# Patient Record
Sex: Male | Born: 1960 | Race: White | Hispanic: No | Marital: Married | Smoking: Never smoker
Health system: Southern US, Community
[De-identification: ages and names within clinical notes are randomized; demographics above are authoritative.]

## PROBLEM LIST (undated history)

## (undated) DIAGNOSIS — I2510 Atherosclerotic heart disease of native coronary artery without angina pectoris: Secondary | ICD-10-CM

## (undated) DIAGNOSIS — T883XXA Malignant hyperthermia due to anesthesia, initial encounter: Secondary | ICD-10-CM

## (undated) DIAGNOSIS — F419 Anxiety disorder, unspecified: Secondary | ICD-10-CM

## (undated) DIAGNOSIS — Z8489 Family history of other specified conditions: Secondary | ICD-10-CM

## (undated) HISTORY — PX: MEDIAL COLLATERAL LIGAMENT AND LATERAL COLLATERAL LIGAMENT REPAIR, ELBOW: SHX2016

## (undated) HISTORY — PX: ARTHROSCOPY, HIP, WITH LABRUM REPAIR: SHX7156

## (undated) HISTORY — PX: MEDIAL COLLATERAL LIGAMENT REPAIR, ELBOW: SHX2018

---

## 1986-03-06 HISTORY — PX: ROTATOR CUFF REPAIR: SHX139

## 1996-03-06 HISTORY — PX: GANGLION CYST EXCISION: SHX1691

## 1998-03-06 HISTORY — PX: TONSILLECTOMY: SUR1361

## 1998-03-06 HISTORY — PX: ROTATOR CUFF REPAIR: SHX139

## 1998-03-06 HISTORY — PX: OTHER SURGICAL HISTORY: SHX169

## 1998-03-06 HISTORY — PX: SEPTOPLASTY: SUR1290

## 1999-10-10 ENCOUNTER — Ambulatory Visit (HOSPITAL_COMMUNITY): Admission: RE | Admit: 1999-10-10 | Discharge: 1999-10-10 | Payer: Self-pay | Admitting: Specialist

## 1999-10-10 ENCOUNTER — Encounter: Payer: Self-pay | Admitting: Specialist

## 2000-07-26 ENCOUNTER — Encounter: Admission: RE | Admit: 2000-07-26 | Discharge: 2000-07-26 | Payer: Self-pay | Admitting: Emergency Medicine

## 2000-07-26 ENCOUNTER — Encounter: Payer: Self-pay | Admitting: Emergency Medicine

## 2000-08-01 ENCOUNTER — Encounter: Admission: RE | Admit: 2000-08-01 | Discharge: 2000-08-01 | Payer: Self-pay | Admitting: Family Medicine

## 2000-08-01 ENCOUNTER — Encounter: Payer: Self-pay | Admitting: Family Medicine

## 2002-04-18 ENCOUNTER — Inpatient Hospital Stay (HOSPITAL_COMMUNITY): Admission: RE | Admit: 2002-04-18 | Discharge: 2002-04-20 | Payer: Self-pay | Admitting: Otolaryngology

## 2002-04-18 ENCOUNTER — Encounter (INDEPENDENT_AMBULATORY_CARE_PROVIDER_SITE_OTHER): Payer: Self-pay | Admitting: *Deleted

## 2003-11-27 ENCOUNTER — Encounter: Admission: RE | Admit: 2003-11-27 | Discharge: 2003-11-27 | Payer: Self-pay | Admitting: Family Medicine

## 2006-12-19 ENCOUNTER — Encounter: Admission: RE | Admit: 2006-12-19 | Discharge: 2006-12-19 | Payer: Self-pay | Admitting: Family Medicine

## 2007-01-03 ENCOUNTER — Encounter: Admission: RE | Admit: 2007-01-03 | Discharge: 2007-01-03 | Payer: Self-pay | Admitting: Family Medicine

## 2007-07-23 ENCOUNTER — Encounter: Admission: RE | Admit: 2007-07-23 | Discharge: 2007-07-23 | Payer: Self-pay | Admitting: Family Medicine

## 2007-08-05 ENCOUNTER — Encounter: Admission: RE | Admit: 2007-08-05 | Discharge: 2007-08-05 | Payer: Self-pay | Admitting: Family Medicine

## 2008-03-06 HISTORY — PX: INGUINAL HERNIA REPAIR: SUR1180

## 2008-08-06 ENCOUNTER — Ambulatory Visit (HOSPITAL_COMMUNITY): Admission: RE | Admit: 2008-08-06 | Discharge: 2008-08-07 | Payer: Self-pay | Admitting: General Surgery

## 2009-09-02 ENCOUNTER — Encounter: Admission: RE | Admit: 2009-09-02 | Discharge: 2009-09-02 | Payer: Self-pay | Admitting: Family Medicine

## 2009-09-02 IMAGING — CR DG CERVICAL SPINE COMPLETE 4+V
5 series · 5 of 5 positions shown · non-contrast
Comparison: None.

CLINICAL DATA: Left neck pain radiating to the left shoulder.
Headaches.

CERVICAL SPINE - COMPLETE 4+ VIEW

[view not recorded (1 of 5)]
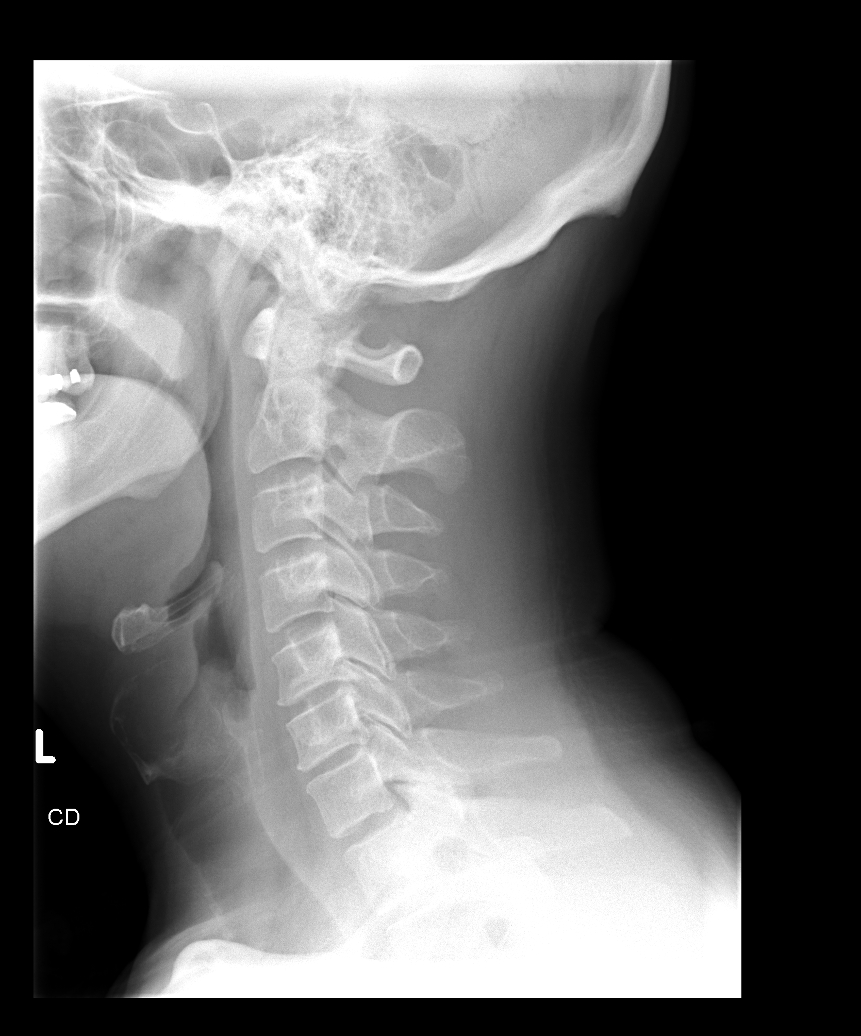

[view not recorded (2 of 5)]
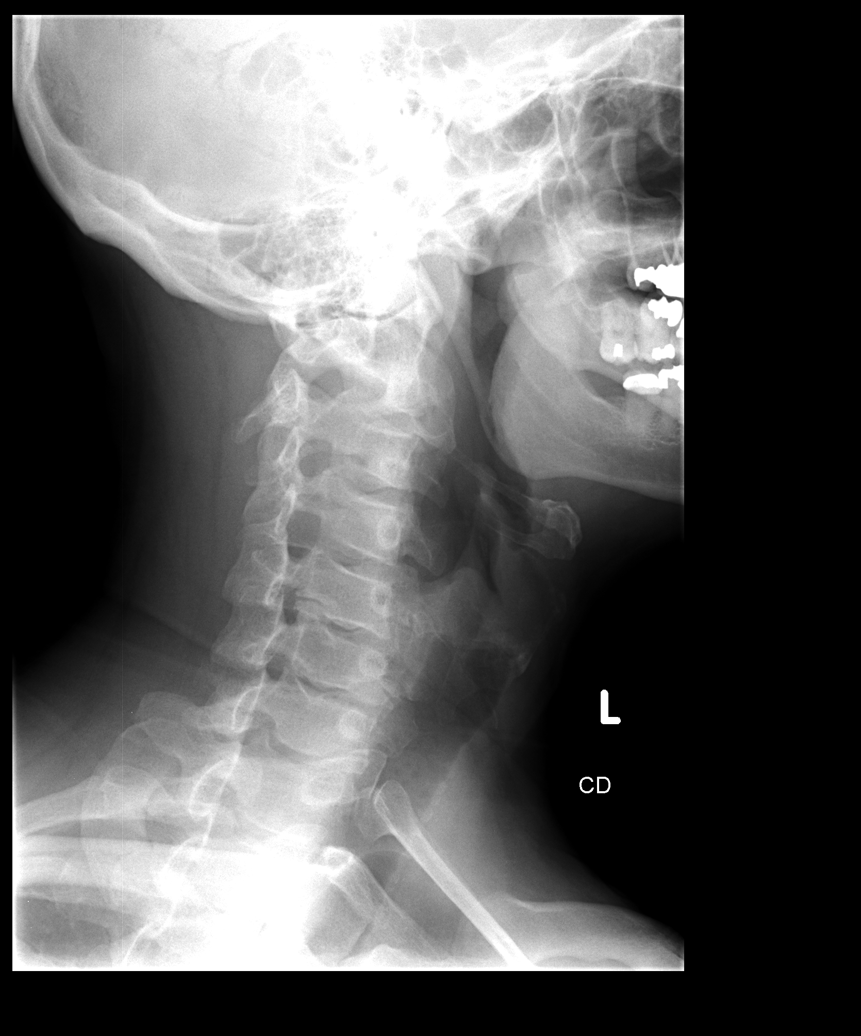

[view not recorded (3 of 5)]
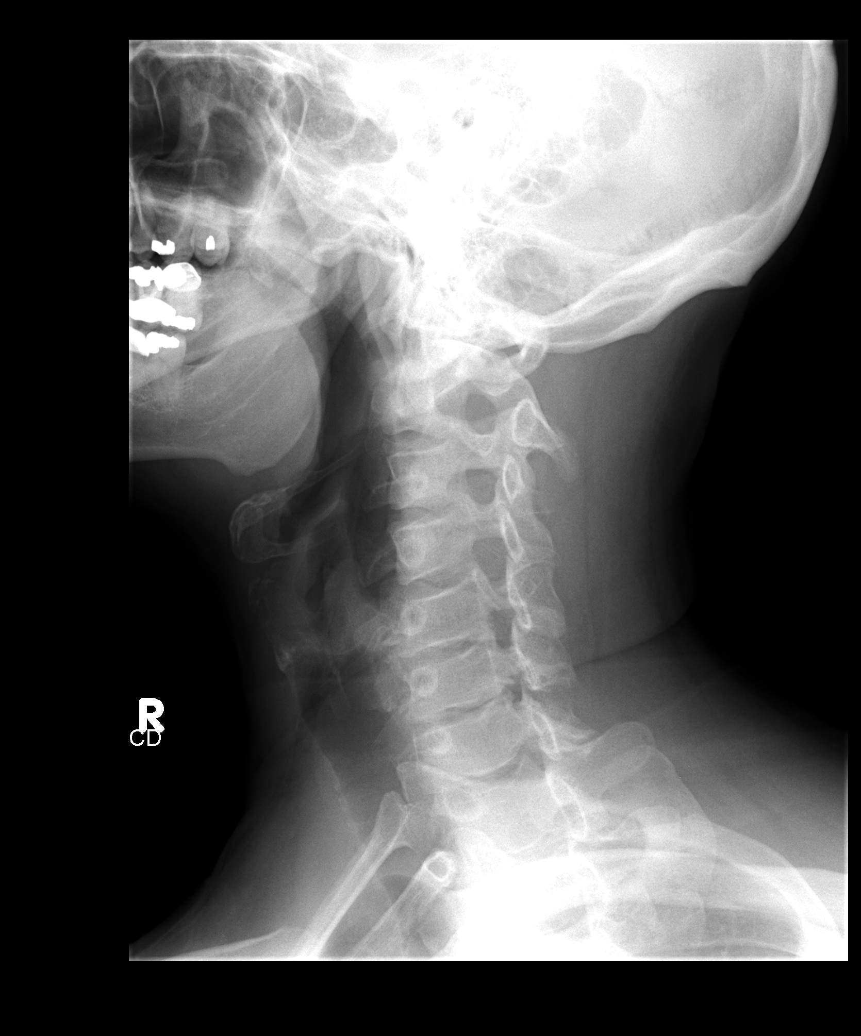

[view not recorded (4 of 5)]
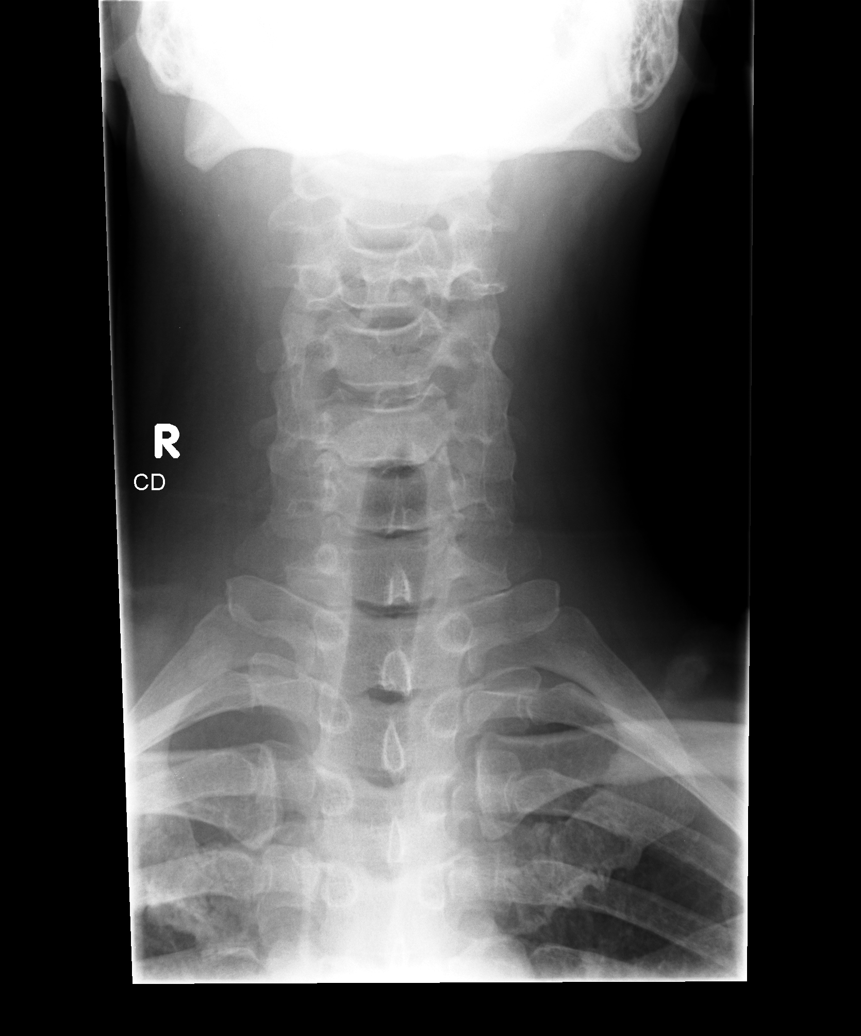

[view not recorded (5 of 5)]
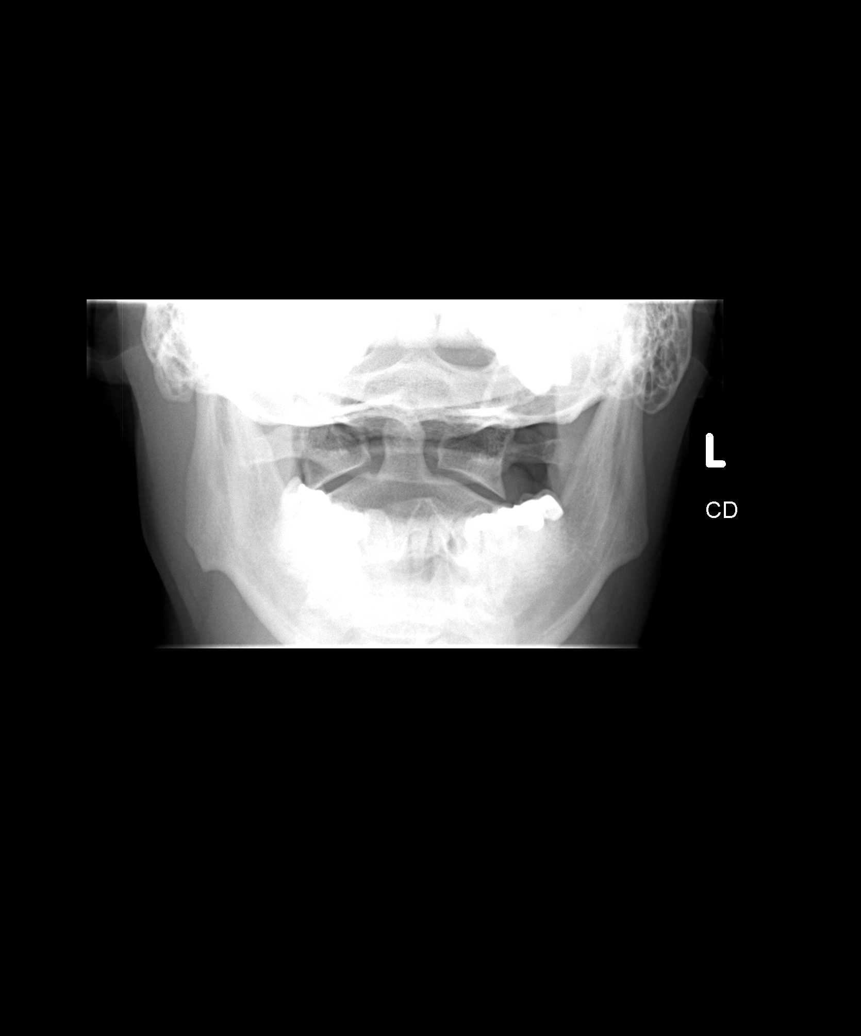

[5 of 5 positions shown; findings below may reference images not displayed]

FINDINGS: The cervical spine is visualized from the occiput to the
T1-2 junction.  Alignment is anatomic.  Prevertebral soft tissues
are within normal limits.  Vertebral body height is maintained.
Mild uncovertebral hypertrophy and endplate degenerative changes at
C5-6 and C6-7.  Mild loss of disc space height is seen at these
levels as well.  Mild right neural foraminal narrowing at C5-6 and
mild to moderate left foraminal narrowing at C6-7.  Visualized lung
apices are clear.
IMPRESSION: Spondylosis, worst at C5-6 and C6-7, as above.

## 2010-06-13 LAB — COMPREHENSIVE METABOLIC PANEL
Alkaline Phosphatase: 47 U/L (ref 39–117)
BUN: 10 mg/dL (ref 6–23)
CO2: 28 mEq/L (ref 19–32)
GFR calc non Af Amer: >60 mL/min (ref >60)
Glucose, Bld: 89 mg/dL (ref 70–99)
Potassium: 4.4 mEq/L (ref 3.5–5.1)
Total Protein: 6.8 g/dL (ref 6.0–8.3)

## 2010-06-13 LAB — DIFFERENTIAL
Basophils Absolute: 0 10*3/uL (ref 0.0–0.1)
Basophils Relative: 1 % (ref 0–1)
Eosinophils Absolute: 0.1 10*3/uL (ref 0.0–0.7)
Monocytes Relative: 10 % (ref 3–12)
Neutro Abs: 2.5 10*3/uL (ref 1.7–7.7)
Neutrophils Relative %: 49 % (ref 43–77)

## 2010-06-13 LAB — CBC
HCT: 44.5 % (ref 39.0–52.0)
Hemoglobin: 15.1 g/dL (ref 13.0–17.0)
MCHC: 33.9 g/dL (ref 30.0–36.0)
RBC: 4.88 MIL/uL (ref 4.22–5.81)
RDW: 13.2 % (ref 11.5–15.5)

## 2010-07-19 NOTE — Op Note (Signed)
NAME:  Isaac Knight, Isaac Knight NO.:  [id number]   MEDICAL RECORD NO.:  [id number]          PATIENT TYPE:  OIB   LOCATION:  5120                         FACILITY:  [facility]   PHYSICIAN:  Adolph Pollack, M.D.DATE OF BIRTH:  Jul 22, 1960   DATE OF PROCEDURE:  08/06/2008  DATE OF DISCHARGE:                               OPERATIVE REPORT   PREOPERATIVE DIAGNOSIS:  Left inguinal hernia.   POSTOPERATIVE DIAGNOSIS:  Left inguinal hernia.   PROCEDURE:  Laparoscopic repair of left inguinal hernia with mesh.   SURGEON:  Adolph Pollack, MD   ANESTHESIA:  General.   INDICATIONS:  Mr. Nancarrow is a 50 year old male who was doing some  outdoor work and lifting something heavy, noticed a swelling in the  groin area.  He had some intermittent stinging there and the swelling is  getting larger.  He has a left inguinal hernia by exam and now presents  for repair.  We discussed the open and laparoscopic repair and he has  chosen laparoscopic.  We went over the procedure risks and aftercare  preoperatively.   TECHNIQUE:  He was seen in the holding area and the left groin marked  with my initials.  He voided and then was brought back to the operating  room, placed supine on the operating table, and general anesthetic was  administered.  The hair on the abdominal wall and groin was clipped and  the area sterilely prepped and draped.  Marcaine was infiltrated in the  subumbilical region.  A small transverse subumbilical incision was made  through the skin and subcutaneous tissue.  Using blunt dissection, I  identified the anterior left rectus sheath and made a small transverse  incision in it.  The underlying rectus muscle was swept laterally  exposing the posterior rectus sheath.  A balloon dissection device was  then placed in the extraperitoneal space under laparoscopic vision.  Balloon dissection was performed of the left extraperitoneal space,  lower midline, and part of the  right extraperitoneal space.  Following  this, the balloon was removed and CO2 gas was insufflated creating a  working area.   Under laparoscopic vision, two 5-mm trocars were placed in the lower  abdomen just to the right side of the midline.  Using blunt dissection,  I exposed the pubic tubercle and then Cooper ligament on the left side.  I then dissected fibrofatty tissue away from the anterior and lateral  abdominal walls all the way back to the level of the umbilicus.  I  isolated the spermatic cord, created a window around it, and noted an  indirect sac.  Using blunt dissection, I dissected the indirect sac away  from the spermatic cord structures.  A patulous and enlarged internal  ring was noted.  There was a peritoneal tear made, but I was able to  roll it up and fold it on itself to seal it.  I dissected the sac back  to the level of the umbilicus.   Following this, I brought a piece of the Parietex TECR 15 x 15 mesh into  the field and cut it 5  x 6 edges.  A partial longitudinal slit was cut  into it and was placed into the left extraperitoneal space.  He was then  positioned, 2 cords were wrapped around the tails and at the mesh.  We  covered the direct, indirect, and femoral spaces with good overlap.  The  mesh was then anchored to [employer].  The anterior and lateral  abdominal wall with spiral tacks.   I then inspected the area and noted that the hemostasis was adequate.  The inferolateral aspect of the mesh was then held down.  The CO2 gas  was released and extraperitoneal contents were noted to approximate the  mesh.  I then removed all trocars.   The left anterior rectus sheath defect was then closed with interrupted  0-Vicryl sutures.  The skin incisions were closed with 4-0 Monocryl  subcuticular stitches.  Steri-Strips and sterile dressings were applied.   He tolerated the procedure without apparent complications and was taken  to recovery in satisfactory  condition.      Adolph Pollack, M.D.  Electronically Signed     TJR/MEDQ  D:  08/06/2008  T:  08/07/2008  Job:  [id number]

## 2010-07-22 NOTE — Op Note (Signed)
Isaac Knight, Isaac Knight NO.:  [id number]   MEDICAL RECORD NO.:  [id number]                   PATIENT TYPE:  OIB   LOCATION:  2550                                 FACILITY:  [facility]   PHYSICIAN:  Zola Button T. Lazarus Salines, M.D.              DATE OF BIRTH:  [birth year]   DATE OF PROCEDURE:  04/18/2002  DATE OF DISCHARGE:                                 OPERATIVE REPORT   PREOPERATIVE DIAGNOSES:  1. 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  2. Cryptic imbedded tonsils with frequent debris and halitosis.  3. Snoring.   POSTOPERATIVE DIAGNOSES:  1. 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  2. Cryptic imbedded tonsils with frequent debris and halitosis.  3. Snoring.   PROCEDURE:  1. Nasal septoplasty.  2. Bilateral SMR inferior turbinates.  3. Bilateral tonsillectomy.  4. LAUP.   SURGEON:  Gloris Manchester. Lazarus Salines, M.D.   ANESTHESIA:  General orotracheal.   ESTIMATED BLOOD LOSS:  Minimal.   COMPLICATIONS:  None.   FINDINGS:  Overall narrow nose with a rightward septal deviation and very  large inferior turbinates. No polyps or active drainage.  2+ bulky imbedded  tonsils with copious cryptic debris and moderate fibrosis.  A very long,  thick central uvula and a moderately long thin posterior tonsillar pillar on  each side.   DESCRIPTION OF PROCEDURE:  With the patient in a comfortable supine  position, general orotracheal anesthesia was induced without difficulty.  At  an appropriate level, the patient was placed in a slight sitting position  and the table was rotated.  Saline-moistened throat pack was placed.  Nasal  vibrissa were trimmed.  Cocaine crystals, 160 mg total were applied on  cotton carriers to the anterior ethmoid and sphenopalatine ganglion regions  on both sides.  Cocaine solution, 160 mg total was applied on 1/2 x 3 inch  cottonoids to both sides of  the septal mucosa.  1% Xylocaine with 1:100,000  epinephrine, 10 mL total was infiltrated into the anterior floor of the  nose, into the nasal spine region, into the membranous columella, and into  the submucoperichondrial plane of the septum on both sides.  Several minutes  were allowed for this to take effect. A sterile preparation and draping of  the mid face was accomplished.   The materials were removed from the nose, observed to be intact, and correct  in number. The findings were as described above. A left-sided  hemitransfixion incision was sharply executed and carried down to the caudal  edge of the quadrangular cartilage. This was continued down to a floor  incision. A small right-sided floor incision was executed.  Floor tunnels  were developed on both sides, brought up to the vomer, and brought forward  along the maxillary crest.  The submucoperichondrial plane of the left  septum was elevated up to the  dorsum of the nose, back to the perpendicular  plate, down to communicate with the posterior tunnel. It was quite fibrotic  in the region of the nasal spine, but was carefully dissected. There was one  small linear rent on the anterior inferior septum at this area.   Upon developing the septal tunnels, the findings were further identified as  above. The chondroethmoid junction was identified and opened with the caudal  elevator and the opposite submucoperiosteal plane of the perpendicular plate  was elevated.  The superior perpendicular plate was lysed with an open  Jansen-Middleton forceps to avoid rocking the cribriform plate region. The  inferior portion was submucosally dissected and rocked free using the closed  Jansen-Middleton forceps and with Takahashi forceps.  Small bony remnants  and spicules were carefully removed.  At this point, the posterior inferior  corner of the quadrangular cartilage leaving a 2 cm caudal and dorsal strut,  was submucosally resected and a 2  mm inferior incision was made along the  maxillary crest to remove the spurred quadrangular cartilage in this region.  This allowed the septum to trapdoor more freely toward the midline with good  support. A small additional portion of the posterior and inferior corner was  resected where it was angulated toward the right.  The nasal septum was  separated from the upper lateral cartilages using a 15 blade in the tunnel  on the left side and transmucosally on the right side. This allowed more  free mobilization of the septum which was straight and easily brought into  the midline.  The maxillary crest posterior to the caudal strut was resected  using an osteotome. At this point, the tunnel was carefully suctioned and no  bony remnants were observed.  The septum was secured to the nasal spine with  a figure-of-eight 4-0 PDS suture.  A good midline approximation was felt to  have been accomplished.  The septal incisions were closed with interrupted 4-  0 chromic suture.   Just prior to completing the septal incisions, the inferior turbinates on  each side were infiltrated with approximately 4 mL of 1% Xylocaine with  1:100,000 epinephrine. After completing the septoplasty, beginning on the  right side, the anterior hood of the inferior turbinate was lysed just  behind the nasal valve.  The medial mucosa of the turbinate was incised in  an anterior upsloping fashion and a laterally based flap was developed.  The  turbinate was then fractured.  Using angled turbinate scissors, turbinate  bone and lateral mucosa was resected in a posterior downsloping fashion  taking virtually all of the anterior pole and leaving virtually all of the  posterior pole.  The bony support for the turbinate was partially resected  submucosally.  The posterior pole and the cut edges were suction coagulated.  The lateral flap was laid down, the turbinate was out-fractured, and this side was completed.  The left side  was done in identical fashion.   At this point, 0.04 reinforced Silastic splints were fashioned, placed on  each side against the septum, and secured there with a 3-0 nylon stitch.  Triple-thickness Neosporin-impregnated Telfa packs were placed low in the  nose along the turbinates on each side with the septum for hemostasis along  the turbinates. At this point, the nasal portion of the procedure was  completed. Hemostasis was observed.   At this point, the patient was placed in Trendelenburg.  The throat pack was  carefully removed, taking care to protect  lips, teeth, and endotracheal  tube. The Crowe-Davis mouth gag was introduced, expanded for visualization,  suspended from the Mayo stand in the standard fashion. The findings were as  described above. Palate retractor and mirror were used to visualize the  nasopharynx with the findings as described above.  0.5% Xylocaine with  1:200,000 epinephrine, 8 mL total was infiltrated in the peritonsillar  planes for intraoperative hemostasis. Several minutes were allowed for this  to take effect.   Beginning on the left side, the tonsil was grasped and retracted medially.  The mucosa overlying the inferior and superior poles were coagulated and  then cut down to the capsule of the tonsil.  Staying directly on the tonsil  capsule, using the cautery to lyse fibrous bands, coagulate crossing  vessels, and as a blunt dissector when possible, the tonsil was dissected  free of its muscular fossa from superiorly downward. The tonsil was removed  in its entirety as determined by examination of both tonsil and fossa.  A  small additional quantity of cautery rendered the fossa hemostatic.  After  completed left tonsillectomy, the right side was performed in identical  fashion.   After performing both tonsillectomies and rendering the oropharynx  hemostatic, saline saturated eye pads were placed and the patient was  carefully draped with saline  saturated towels.  The laser was brought into  the field.  All personnel in the room had protective eye gear.  The laser  had been previously tested for aim and function. Using the laser in the  continuous mode 10 watts, the uvula was partially resected leaving a  posterior flap which was later brought forward in a Chevron configuration.  Two demyelinating incisions were made along the lateral aspect of the uvula  up into the soft palate.  Hemostasis was spontaneous. A 4 mm wide slip of  posterior fossa arch mucosa leading down toward the tonsillectomy was  resected on both sides.  The mucosa of the uvula was brought forward and  secured to the anterior mucosa and several stitches were placed at the  lateral aspects to delimit the neouvula from the palatal incisions.  Hemostasis was observed.  Note that a saline-moistened 4x4 had been placed  in the oral and nasopharynx to support the palate through use of the laser. This was now removed. The mouth gag was relaxed for several minutes. Upon  reexpansion hemostasis was persistent at all sites.  At this point, the  procedure was completed. The mouth gag was relaxed and removed.  The dental  status was intact. The patient was returned to anesthesia, awakened,  extubated, and transferred to the recovery room in stable condition.   COMMENTS:  A 50 year old white male with a long history of nasal  obstruction, but no clearcut evidence of prior trauma, with the findings of  deviated septum and large turbinates was the indication for that portion of  the procedure.  The patient does have frequent mild sore throats, copious  debris in his throat, and halitosis, hence the indication for tonsillectomy.  Finally, the patient does have some snoring and sleep interruption and LAUP  was felt to be indicated to assist with this portion of the problem.  Anticipated routine postoperative recovery with 23-hour observation,  analgesia, antibiosis, hydration,  and observation for bleeding, emesis, or  airway compromise.  Given low anticipated risk of postanesthetic or post  surgical complications, I feel he is acceptable to discharge after 23 hours.  Gloris Manchester. Lazarus Salines, M.D.    KTW/MEDQ  D:  04/18/2002  T:  04/18/2002  Job:  [id number]   cc:   Mosetta Putt, M.D.  [address] [place]  [place] [address]  Fax: [phone]

## 2010-11-02 ENCOUNTER — Other Ambulatory Visit: Payer: Self-pay | Admitting: Internal Medicine

## 2010-11-14 ENCOUNTER — Ambulatory Visit (AMBULATORY_SURGERY_CENTER): Payer: Managed Care, Other (non HMO) | Admitting: *Deleted

## 2010-11-14 ENCOUNTER — Encounter: Payer: Self-pay | Admitting: Gastroenterology

## 2010-11-14 VITALS — Ht 68.0 in | Wt 184.6 lb

## 2010-11-14 DIAGNOSIS — Z1211 Encounter for screening for malignant neoplasm of colon: Secondary | ICD-10-CM

## 2010-11-14 MED ORDER — PEG-KCL-NACL-NASULF-NA ASC-C 100 G PO SOLR: ORAL | Status: DC

## 2010-11-14 NOTE — Progress Notes (Addendum)
Sister has history malignant hyperthermia and almost died during surgery. Patient has had anesthesia in past without problems.  He said that he did have one surgery that he was hard to wake up after anesthesia and had to be admitted to hospital.  Pt changed to propofol sedation and will talk to nurse anaesthetist. Ezra Sites  Talked with Brennan Bailey, Nurse Anesthetist.  He says pt will be okay for Propofol sedation.  He asked for chart to be flagged for anesthetist's awareness day of procedure.  Chart flagged as requested. Ezra Sites

## 2010-11-15 ENCOUNTER — Encounter: Payer: Self-pay | Admitting: *Deleted

## 2010-11-15 NOTE — Progress Notes (Signed)
Sister has history malignant hyperthermia and almost died during surgery. Patient has had anesthesia in past without problems.  He said that he did have one surgery that he was hard to wake up after anesthesia and had to be admitted to hospital.  Pt changed to propofol sedation and will talk to nurse anaesthetist. Kathy Smith  Talked with Mark Smith, Nurse Anesthetist.  He says pt will be okay for Propofol sedation.  He asked for chart to be flagged for anesthetist's awareness day of procedure.  Chart flagged as requested. Kathy Smith     

## 2010-11-25 ENCOUNTER — Other Ambulatory Visit: Payer: Self-pay | Admitting: Gastroenterology

## 2010-12-19 ENCOUNTER — Encounter: Payer: Managed Care, Other (non HMO) | Admitting: Gastroenterology

## 2011-01-24 ENCOUNTER — Ambulatory Visit (AMBULATORY_SURGERY_CENTER): Payer: Managed Care, Other (non HMO) | Admitting: Gastroenterology

## 2011-01-24 ENCOUNTER — Encounter: Payer: Self-pay | Admitting: Gastroenterology

## 2011-01-24 VITALS — BP 118/98 | HR 76 | Temp 98.7°F | Resp 16 | Ht 68.0 in | Wt 184.0 lb

## 2011-01-24 DIAGNOSIS — Z1211 Encounter for screening for malignant neoplasm of colon: Secondary | ICD-10-CM

## 2011-01-24 MED ORDER — SODIUM CHLORIDE 0.9 % IV SOLN: 500.0000 mL | INTRAVENOUS | Status: DC

## 2011-01-24 NOTE — Patient Instructions (Addendum)
Please refer to the blue and neon green sheets for instructions regarding diet and activity for the rest of today.  Handouts on diverticulosis and a high fiber diet given. Resume previous medications. 

## 2011-01-24 NOTE — Progress Notes (Signed)
Patient did not experience any of the following events: a burn prior to discharge; a fall within the facility; wrong site/side/patient/procedure/implant event; or a hospital transfer or hospital admission upon discharge from the facility. (G8907) Patient did not have preoperative order for IV antibiotic SSI prophylaxis. ([id number])  

## 2011-01-25 ENCOUNTER — Telehealth: Payer: Self-pay

## 2011-01-25 NOTE — Telephone Encounter (Signed)
No ID on answering machine. 

## 2012-07-04 ENCOUNTER — Ambulatory Visit
Admission: RE | Admit: 2012-07-04 | Discharge: 2012-07-04 | Disposition: A | Discharge status: Home / Self Care | Payer: Managed Care, Other (non HMO) | Source: Ambulatory Visit | Attending: Chiropractic Medicine | Admitting: Chiropractic Medicine

## 2012-07-04 ENCOUNTER — Other Ambulatory Visit: Payer: Self-pay | Admitting: Chiropractic Medicine

## 2012-07-04 DIAGNOSIS — M5030 Other cervical disc degeneration, unspecified cervical region: Secondary | ICD-10-CM

## 2012-07-11 ENCOUNTER — Other Ambulatory Visit: Payer: Self-pay | Admitting: Chiropractic Medicine

## 2012-07-11 DIAGNOSIS — M25511 Pain in right shoulder: Secondary | ICD-10-CM

## 2012-07-12 ENCOUNTER — Ambulatory Visit
Admission: RE | Admit: 2012-07-12 | Discharge: 2012-07-12 | Disposition: A | Discharge status: Home / Self Care | Payer: Managed Care, Other (non HMO) | Source: Ambulatory Visit | Attending: Chiropractic Medicine | Admitting: Chiropractic Medicine

## 2012-07-12 DIAGNOSIS — M25511 Pain in right shoulder: Secondary | ICD-10-CM

## 2012-09-19 ENCOUNTER — Other Ambulatory Visit: Payer: Self-pay | Admitting: Family Medicine

## 2012-09-19 ENCOUNTER — Ambulatory Visit
Admission: RE | Admit: 2012-09-19 | Discharge: 2012-09-19 | Disposition: A | Discharge status: Home / Self Care | Payer: Managed Care, Other (non HMO) | Source: Ambulatory Visit | Attending: Family Medicine | Admitting: Family Medicine

## 2012-09-19 DIAGNOSIS — T1490XA Injury, unspecified, initial encounter: Secondary | ICD-10-CM

## 2013-10-30 ENCOUNTER — Ambulatory Visit
Admission: RE | Admit: 2013-10-30 | Discharge: 2013-10-30 | Disposition: A | Discharge status: Home / Self Care | Payer: PRIVATE HEALTH INSURANCE | Source: Ambulatory Visit | Attending: Family Medicine | Admitting: Family Medicine

## 2013-10-30 ENCOUNTER — Other Ambulatory Visit: Payer: Self-pay | Admitting: Family Medicine

## 2013-10-30 DIAGNOSIS — M79604 Pain in right leg: Secondary | ICD-10-CM

## 2014-08-12 ENCOUNTER — Encounter: Payer: Self-pay | Admitting: Podiatry

## 2014-08-12 ENCOUNTER — Ambulatory Visit (INDEPENDENT_AMBULATORY_CARE_PROVIDER_SITE_OTHER): Payer: 59 | Admitting: Podiatry

## 2014-08-12 VITALS — BP 130/81 | HR 53 | Resp 18

## 2014-08-12 DIAGNOSIS — M25373 Other instability, unspecified ankle: Secondary | ICD-10-CM | POA: Diagnosis not present

## 2014-08-12 DIAGNOSIS — M2011 Hallux valgus (acquired), right foot: Secondary | ICD-10-CM | POA: Diagnosis not present

## 2014-08-12 DIAGNOSIS — M205X1 Other deformities of toe(s) (acquired), right foot: Secondary | ICD-10-CM | POA: Diagnosis not present

## 2014-08-12 NOTE — Progress Notes (Signed)
   Subjective:    Patient ID: Isaac Knight, male    DOB: October 25, 1960, 54 y.o.   MRN: [id number] PATIENT GOES BY "Isaac"  HPI  54 year old male percent the office today with multiple complaints was right foot/ankle. He was referred here by Dr. Elijah Birkom for painful fifth toe. He states that he continue current lesion on the side of his toe and between his fourth and fifth toe. He previously felt the area was a wart he tried an over-the-counter wart remover pad which helped however the symptoms did reoccur. He states that when it reoccurred he saw Dr. Elijah Birkom and he is really referred due to a bone spur and recurring callus overlying the area. He states that he is very active with exercising [employer] in the area becomes painful with activity. He also states that he started to have pain along the side of the prominent bunion to his right foot proceed with exercise. He denies any redness over the area. Lastly he states that he has a weak ankle on his right side after these had multiple recurrent ankle sprains. [facility]. He does not wear brace and the symptoms are only present with activity. No other complaints at this time.    Review of Systems  All other systems reviewed and are negative.      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 There is a small hyperkeratotic lesion on the medial aspect of the right fifth toe due to pressure where it abuts the fourth digit. There is no overlying edema, erythema, drainage, or other signs of infection. Currently there is no tenderness over this area. 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 There is no  pain or crepitation with first MTPJ range of motion. There is no hypermobility present. There does appear to be increase in anterior drawer on the right ankle compared to the contralateral extended. Talar tilt test is negative. There is no tenderness along the course ATFL this time however subjectively he doesn't the has pain with prolonged activity due to this area. No other areas of tenderness to bilateral lower extremities. MMT 5/5, ROM WNL.  No open lesions or pre-ulcerative lesions.  No overlying edema, erythema, increase in warmth to bilateral lower extremities.  No pain with calf compression, swelling, warmth, erythema bilaterally.     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 -Treatment options discussed including all alternatives, risks, and complications -Discussed conservative and surgical fixation. -Discussed them PIPJ arthroplasty of the fourth and fifth digits of the right foot, possible bunion repair, possible lateral ankle stabilization. He states that he'll to continue conservative treatment for now and then possibly in the fall or winter after the summer is over he'll consider surgical intervention symptoms persist. -Follow-up as needed. In the meantime I encouraged him call the office with any questions, concerns, change in symptoms.

## 2015-11-10 ENCOUNTER — Ambulatory Visit (INDEPENDENT_AMBULATORY_CARE_PROVIDER_SITE_OTHER): Payer: 59

## 2015-11-10 ENCOUNTER — Ambulatory Visit (INDEPENDENT_AMBULATORY_CARE_PROVIDER_SITE_OTHER): Payer: 59 | Admitting: Podiatry

## 2015-11-10 DIAGNOSIS — M79674 Pain in right toe(s): Secondary | ICD-10-CM

## 2015-11-10 DIAGNOSIS — M898X9 Other specified disorders of bone, unspecified site: Secondary | ICD-10-CM

## 2015-11-10 DIAGNOSIS — L84 Corns and callosities: Secondary | ICD-10-CM

## 2015-11-10 NOTE — Patient Instructions (Addendum)
Corns and Calluses Corns are small areas of thickened skin that occur on the top, sides, or tip of a toe. They contain a cone-shaped core with a point that can press on a nerve below. This causes pain. Calluses are areas of thickened skin that can occur anywhere on the body including hands, fingers, palms, soles of the feet, and heels.Calluses are usually larger than corns.  CAUSES  Corns and calluses are caused by rubbing (friction) or pressure, such as from shoes that are too tight or do not fit properly.  RISK FACTORS Corns are more likely to develop in people who have toe deformities, such as hammer toes. Since calluses can occur with friction to any area of the skin, calluses are more likely to develop in people who:   Work with their hands.  Wear shoes that fit poorly, shoes that are too tight, or shoes that are high-heeled.  Have toes deformities. SYMPTOMS Symptoms of a corn or callus include:  A hard growth on the skin.   Pain or tenderness under the skin.   Redness and swelling.   Increased discomfort while wearing tight-fitting shoes. DIAGNOSIS  Corns and calluses may be diagnosed with a medical history and physical exam.  TREATMENT  Corns and calluses may be treated with:  Removing the cause of the friction or pressure. This may include:  Changing your shoes.  Wearing shoe inserts (orthotics) or other protective layers in your shoes, such as a corn pad.  Wearing gloves.  Medicines to help soften skin in the hardened, thickened areas.  Reducing the size of the corn or callus by removing the dead layers of skin.  Antibiotic medicines to treat infection.  Surgery, if a toe deformity is the cause. HOME CARE INSTRUCTIONS   Take medicines only as directed by your health care provider.  If you were prescribed an antibiotic, finish all of it even if you start to feel better.  Wear shoes that fit well. Avoid wearing high-heeled shoes and shoes that are too tight  or too loose.  Wear any padding, protective layers, gloves, or orthotics as directed by your health care provider.  Soak your hands or feet and then use a file or pumice stone to soften your corn or callus. Do this as directed by your health care provider.  Check your corn or callus every day for signs of infection. Watch for:  Redness, swelling, or pain.  Fluid, blood, or pus. SEEK MEDICAL CARE IF:   Your symptoms do not improve with treatment.  You have increased redness, swelling, or pain at the site of your corn or callus.  You have fluid, blood, or pus coming from your corn or callus.  You have new symptoms.   This information is not intended to replace advice given to you by your health care provider. Make sure you discuss any questions you have with your health care provider.   Document Released: 11/27/2003 Document Revised: 07/07/2014 Document Reviewed: 02/16/2014 Elsevier Interactive Patient Education 2016 [employer]  

## 2015-11-10 NOTE — Progress Notes (Signed)
Subjective: Patient presents today complaining of pain to the interdigital area of the fourth and fifth digit of the right foot. The patient has had corns and calluses in the same area in the past previously debrided by Dr. Loreta AveWagner. Recently the Corning calluses become inflamed and painful over the last couple of weeks. The patient is an active [employer]. No other complaints at this time   Objective: Physical Exam General: The patient is alert and oriented x3 in no acute distress.  Dermatology: Skin is warm, dry and supple bilateral lower extremities. Negative for open lesions or macerations.  Vascular: Palpable pedal pulses bilaterally. No edema or erythema noted. Capillary refill within normal limits.  Neurological: Epicritic and protective threshold grossly intact bilaterally.   Musculoskeletal Exam: Range of motion within normal limits to all pedal and ankle joints bilateral. Muscle strength 5/5 in all groups bilateral.   Radiographic Exam:  Exostoses noted to the osseous phalanges of the fourth and fifth digits right foot. These osseous structures likely contributing to the interdigital corns and calluses which are painful.  Normal osseous mineralization. Joint spaces preserved. No fracture/dislocation/boney destruction. Calcaneal spur present with mild thickening of plantar fascia.    Assessment: #1 Corning callus formation. Hyperkeratotic lesions digits 4 and 5 right foot #2 bony exostoses digits 4 and 5 right foot #3 pain in right foot  Problem List Items Addressed This Visit    None    Visit Diagnoses    Toe pain, right    -  Primary   Relevant Orders   DG Foot Complete Right   Bony exostosis       4th and 5th digit.    Corns and callosities            Plan of Care:  #1 Patient was evaluated. #2 X-rays were taken right foot 3 views. #3 sharp excisional debridement was performed of the interdigital corns of the fourth and fifth digits of the right foot using a  chisel blade. #4 x-rays were discussed with the patient in regards to the underlying cause of the corns and calluses which is bony spur formation. #5 patient is to return to the clinic on a when necessary basis     Dr. Felecia ShellingBrent M. Marcene Laskowski, DPM [facility]

## 2016-03-03 ENCOUNTER — Other Ambulatory Visit: Payer: Self-pay | Admitting: Family Medicine

## 2016-03-03 ENCOUNTER — Ambulatory Visit
Admission: RE | Admit: 2016-03-03 | Discharge: 2016-03-03 | Disposition: A | Discharge status: Home / Self Care | Payer: PRIVATE HEALTH INSURANCE | Source: Ambulatory Visit | Attending: Family Medicine | Admitting: Family Medicine

## 2016-03-03 DIAGNOSIS — R05 Cough: Secondary | ICD-10-CM

## 2016-03-03 DIAGNOSIS — R059 Cough, unspecified: Secondary | ICD-10-CM

## 2016-12-19 ENCOUNTER — Encounter: Payer: Self-pay | Admitting: Podiatry

## 2016-12-20 NOTE — Progress Notes (Signed)
Pt picked up requested office visit note and signed medical records release form.

## 2018-10-07 ENCOUNTER — Other Ambulatory Visit: Payer: Self-pay

## 2018-10-07 ENCOUNTER — Ambulatory Visit (INDEPENDENT_AMBULATORY_CARE_PROVIDER_SITE_OTHER): Payer: 59 | Admitting: Orthotics

## 2018-10-07 ENCOUNTER — Ambulatory Visit: Payer: 59 | Admitting: Podiatry

## 2018-10-07 ENCOUNTER — Ambulatory Visit (INDEPENDENT_AMBULATORY_CARE_PROVIDER_SITE_OTHER): Payer: 59

## 2018-10-07 ENCOUNTER — Other Ambulatory Visit: Payer: Self-pay | Admitting: Podiatry

## 2018-10-07 ENCOUNTER — Encounter: Payer: Self-pay | Admitting: Podiatry

## 2018-10-07 VITALS — Temp 97.4°F

## 2018-10-07 DIAGNOSIS — M79672 Pain in left foot: Secondary | ICD-10-CM

## 2018-10-07 DIAGNOSIS — M722 Plantar fascial fibromatosis: Secondary | ICD-10-CM

## 2018-10-07 DIAGNOSIS — M2041 Other hammer toe(s) (acquired), right foot: Secondary | ICD-10-CM

## 2018-10-07 DIAGNOSIS — M79671 Pain in right foot: Secondary | ICD-10-CM

## 2018-10-07 DIAGNOSIS — M898X7 Other specified disorders of bone, ankle and foot: Secondary | ICD-10-CM

## 2018-10-07 DIAGNOSIS — M216X9 Other acquired deformities of unspecified foot: Secondary | ICD-10-CM

## 2018-10-07 NOTE — Patient Instructions (Signed)
Pre-Operative Instructions  Congratulations, you have decided to take an important step towards improving your quality of life.  You can be assured that the doctors and staff at [facility] will be with you every step of the way.  Here are some important things you should know:  1. Plan to be at the surgery center/hospital at least 1 (one) hour prior to your scheduled time, unless otherwise directed by the surgical center/hospital staff.  You must have a responsible adult accompany you, remain during the surgery and drive you home.  Make sure you have directions to the surgical center/hospital to ensure you arrive on time. 2. If you are having surgery at [facility] or [facility] hospitals, you will need a copy of your medical history and physical form from your family physician within one month prior to the date of surgery. We will give you a form for your primary physician to complete.  3. We make every effort to accommodate the date you request for surgery.  However, there are times where surgery dates or times have to be moved.  We will contact you as soon as possible if a change in schedule is required.   4. No aspirin/ibuprofen for one week before surgery.  If you are on aspirin, any non-steroidal anti-inflammatory medications (Mobic, Aleve, Ibuprofen) should not be taken seven (7) days prior to your surgery.  You make take Tylenol for pain prior to surgery.  5. Medications - If you are taking daily heart and blood pressure medications, seizure, reflux, allergy, asthma, anxiety, pain or diabetes medications, make sure you notify the surgery center/hospital before the day of surgery so they can tell you which medications you should take or avoid the day of surgery. 6. No food or drink after midnight the night before surgery unless directed otherwise by surgical center/hospital staff. 7. No alcoholic beverages 24-hours prior to surgery.  No smoking 24-hours prior or 24-hours after  surgery. 8. Wear loose pants or shorts. They should be loose enough to fit over bandages, boots, and casts. 9. Don't wear slip-on shoes. Sneakers are preferred. 10. Bring your boot with you to the surgery center/hospital.  Also bring crutches or a walker if your physician has prescribed it for you.  If you do not have this equipment, it will be provided for you after surgery. 11. If you have not been contacted by the surgery center/hospital by the day before your surgery, call to confirm the date and time of your surgery. 12. Leave-time from work may vary depending on the type of surgery you have.  Appropriate arrangements should be made prior to surgery with your employer. 13. Prescriptions will be provided immediately following surgery by your doctor.  Fill these as soon as possible after surgery and take the medication as directed. Pain medications will not be refilled on weekends and must be approved by the doctor. 14. Remove nail polish on the operative foot and avoid getting pedicures prior to surgery. 15. Wash the night before surgery.  The night before surgery wash the foot and leg well with water and the antibacterial soap provided. Be sure to pay special attention to beneath the toenails and in between the toes.  Wash for at least three (3) minutes. Rinse thoroughly with water and dry well with a towel.  Perform this wash unless told not to do so by your physician.  Enclosed: 1 Ice pack (please put in freezer the night before surgery)   1 Hibiclens skin cleaner     Pre-op instructions  If you have any questions regarding the instructions, please do not hesitate to call our office.  [place]: [address], [place], [place] [address] -- [phone]  [place]: [address] [place], [place] [address] -- [phone]  [place]: [address]  [place], [place] [address] -- [phone]  [place]: [address], [address], [place], [place] [address] -- [phone]  Website:  [email or web address] 

## 2018-10-07 NOTE — Progress Notes (Signed)
Patient came into today to be cast for Custom Foot Orthotics. Upon recommendation of Dr. Amalia Hailey Patient presents with High Cavus foot type Goals are Offloading ffoot pressurePlan vendor

## 2018-10-09 NOTE — Progress Notes (Signed)
   HPI: 58 year old male presenting today with a chief complaint of bilateral foot pain that has been ongoing for the past few years. He reports having a bone spur on the right fifth toe and is interested in surgical intervention at this time. Walking and wearing shoes for long periods of time increases the pain. He has been taking OTC antiinflammatories and wearing good shoe gear for treatment. Patient is here for further evaluation and treatment.   History reviewed. No pertinent past medical history.    Objective: Physical Exam General: The patient is alert and oriented x3 in no acute distress.  Dermatology: Skin is cool, dry and supple bilateral lower extremities. Negative for open lesions or macerations.  Vascular: Palpable pedal pulses bilaterally. No edema or erythema noted. Capillary refill within normal limits.  Neurological: Epicritic and protective threshold grossly intact bilaterally.   Musculoskeletal Exam: All pedal and ankle joints range of motion within normal limits bilateral. Muscle strength 5/5 in all groups bilateral. Hammertoe contracture deformity noted to digits 4 and 5 of the right foot. Tenderness to palpation to the plantar aspect of the bilateral heels along the plantar fascia. Exostosis noted to the right fifth toe.   Radiographic Exam: Hammertoe contracture deformity noted to the interphalangeal joints and MPJ of the respective hammertoe digits mentioned on clinical musculoskeletal exam.     Assessment: 1. Exostosis right fifth toe - medial 2. 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 Cavus foot bilateral 4. Plantar fasciitis bilateral    Plan of Care:  1. Patient evaluated. X-Rays reviewed.  2. Today we discussed the conservative versus surgical management of the presenting pathology. The patient opts for surgical management. All possible complications and details of the procedure were explained. All patient questions were answered. No  guarantees were expressed or implied. 3. Authorization for surgery was initiated today. Surgery will consist of PIPJ arthroplasty with derotational skin plasty digits 4, 5 right; exostectomy right 5th toe.  4. Appointment with Liliane Channel, Pedorthist, for custom molded orthotics.  5. Return to clinic one week post op.   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 Goes by Isaac Knight.     Edrick Kins, DPM [facility]  Dr. Edrick Kins, DPM    [address], [place] [address]                Office [phone]  Fax [phone]

## 2018-11-07 ENCOUNTER — Other Ambulatory Visit: Payer: Self-pay

## 2018-11-07 ENCOUNTER — Encounter: Payer: 59 | Admitting: Orthotics

## 2018-11-07 ENCOUNTER — Ambulatory Visit: Payer: 59 | Admitting: Orthotics

## 2018-11-07 DIAGNOSIS — M79671 Pain in right foot: Secondary | ICD-10-CM

## 2018-11-07 DIAGNOSIS — M216X9 Other acquired deformities of unspecified foot: Secondary | ICD-10-CM

## 2018-11-07 DIAGNOSIS — M722 Plantar fascial fibromatosis: Secondary | ICD-10-CM

## 2018-11-07 NOTE — Progress Notes (Signed)
Sending f/o back to [facility] to lower arch

## 2019-01-10 ENCOUNTER — Telehealth: Payer: Self-pay | Admitting: *Deleted

## 2019-01-10 NOTE — Telephone Encounter (Signed)
DOS 01/23/2019 EXOTECTOMY 5TH - 03474 AND HAMMER TOE REPAIR 4,5 - [id number] RIGHT FOOT  [employer]: Eligibility Date - Apr 06, 2018   Co-Insurance - Health Benefit Plan Coverage  In Network Individual  10 %  Service Year   [place] Coverage  In Network Individual  $4,000.00  Service Year  - $0.00  Year to Date  $4,000.00  Remaining   Out of Pocket (Stop Loss) - Health Benefit Plan Coverage  In Network Individual  $7,900.00  Service Year  - $142.58  Year to Date  $7,757.42  Remaining   In Network  SURGICAL  10 %   Pre-certification is REQUIRED: PENDING

## 2019-01-17 NOTE — Telephone Encounter (Signed)
[facility] authorized Isaac Knight's surgery scheduled for 01/23/2019.  The authorization number is [id number].  It is valid from 01/23/2019 to 03/09/2019.

## 2019-01-29 ENCOUNTER — Encounter: Payer: 59 | Admitting: Podiatry

## 2019-02-05 ENCOUNTER — Encounter: Payer: 59 | Admitting: Podiatry

## 2019-02-19 ENCOUNTER — Encounter: Payer: 59 | Admitting: Podiatry

## 2019-03-07 HISTORY — PX: MENISCUS REPAIR: SHX5179

## 2019-12-02 ENCOUNTER — Other Ambulatory Visit: Payer: Self-pay

## 2019-12-02 ENCOUNTER — Ambulatory Visit (HOSPITAL_COMMUNITY)
Admission: RE | Admit: 2019-12-02 | Discharge: 2019-12-02 | Disposition: A | Discharge status: Home / Self Care | Payer: 59 | Source: Ambulatory Visit | Attending: Vascular Surgery | Admitting: Vascular Surgery

## 2019-12-02 ENCOUNTER — Other Ambulatory Visit (HOSPITAL_COMMUNITY): Payer: Self-pay | Admitting: Specialist

## 2019-12-02 DIAGNOSIS — M79605 Pain in left leg: Secondary | ICD-10-CM

## 2019-12-02 DIAGNOSIS — M79604 Pain in right leg: Secondary | ICD-10-CM | POA: Insufficient documentation

## 2020-03-06 HISTORY — PX: TOTAL KNEE ARTHROPLASTY: SHX125

## 2021-03-11 ENCOUNTER — Encounter: Payer: Self-pay | Admitting: Gastroenterology

## 2022-01-05 ENCOUNTER — Ambulatory Visit: Payer: Self-pay | Admitting: Surgery

## 2022-01-05 NOTE — Pre-Procedure Instructions (Signed)
Surgical Instructions    Your procedure is scheduled on January 09, 2022.  Report to [facility] Main Entrance "A" at 12:30 P.M., then check in with the Admitting office.  Call this number if you have problems the morning of surgery:  [phone]   If you have any questions prior to your surgery date call [phone]: Open Monday-Friday 8am-4pm    Remember:  Do not eat after midnight the night before your surgery  You may drink clear liquids until 11:30 AM the morning of your surgery.   Clear liquids allowed are: Water, Non-Citrus Juices (without pulp), Carbonated Beverages, Clear Tea, Black Coffee Only (NO MILK, CREAM OR POWDERED CREAMER of any kind), and Gatorade.     Take these medicines the morning of surgery with A SIP OF WATER:  citalopram (CELEXA)   naphazoline-pheniramine (EYE ALLERGY RELIEF) - may take if needed    As of today, STOP taking any Aspirin (unless otherwise instructed by your surgeon) Aleve, Naproxen, Ibuprofen, Motrin, Advil, Goody's, BC's, all herbal medications, fish oil, and all vitamins.                     Do NOT Smoke (Tobacco/Vaping) for 24 hours prior to your procedure.  If you use a CPAP at night, you may bring your mask/headgear for your overnight stay.   Contacts, glasses, piercing's, hearing aid's, dentures or partials may not be worn into surgery, please bring cases for these belongings.    For patients admitted to the hospital, discharge time will be determined by your treatment team.   Patients discharged the day of surgery will not be allowed to drive home, and someone needs to stay with them for 24 hours.  SURGICAL WAITING ROOM VISITATION Patients having surgery or a procedure may have no more than 2 support people in the waiting area - these visitors may rotate.   Children under the age of 45 must have an adult with them who is not the patient. If the patient needs to stay at the hospital during part of their recovery, the visitor  guidelines for inpatient rooms apply. Pre-op nurse will coordinate an appropriate time for 1 support person to accompany patient in pre-op.  This support person may not rotate.   Please refer to the [facility] website for the visitor guidelines for Inpatients (after your surgery is over and you are in a regular room).    Special instructions:   [place]- Preparing For Surgery  Before surgery, you can play an important role. Because skin is not sterile, your skin needs to be as free of germs as possible. You can reduce the number of germs on your skin by washing with CHG (chlorahexidine gluconate) Soap before surgery.  CHG is an antiseptic cleaner which kills germs and bonds with the skin to continue killing germs even after washing.    Oral Hygiene is also important to reduce your risk of infection.  Remember - BRUSH YOUR TEETH THE MORNING OF SURGERY WITH YOUR REGULAR TOOTHPASTE  Please do not use if you have an allergy to CHG or antibacterial soaps. If your skin becomes reddened/irritated stop using the CHG.  Do not shave (including legs and underarms) for at least 48 hours prior to first CHG shower. It is OK to shave your face.  Please follow these instructions carefully.   Shower the NIGHT BEFORE SURGERY and the MORNING OF SURGERY  If you chose to wash your hair, wash your hair first as usual with your  normal shampoo.  After you shampoo, rinse your hair and body thoroughly to remove the shampoo.  Use CHG Soap as you would any other liquid soap. You can apply CHG directly to the skin and wash gently with a scrungie or a clean washcloth.   Apply the CHG Soap to your body ONLY FROM THE NECK DOWN.  Do not use on open wounds or open sores. Avoid contact with your eyes, ears, mouth and genitals (private parts). Wash Face and genitals (private parts)  with your normal soap.   Wash thoroughly, paying special attention to the area where your surgery will be performed.  Thoroughly rinse  your body with warm water from the neck down.  DO NOT shower/wash with your normal soap after using and rinsing off the CHG Soap.  Pat yourself dry with a CLEAN TOWEL.  Wear CLEAN PAJAMAS to bed the night before surgery  Place CLEAN SHEETS on your bed the night before your surgery  DO NOT SLEEP WITH PETS.   Day of Surgery: Take a shower with CHG soap. Do not wear jewelry or makeup Do not wear lotions, powders, perfumes/colognes, or deodorant. Do not shave 48 hours prior to surgery.  Men may shave face and neck. Do not bring valuables to the hospital.  [facility] is not responsible for any belongings or valuables. Do not wear nail polish, gel polish, artificial nails, or any other type of covering on natural nails (fingers and toes) If you have artificial nails or gel coating that need to be removed by a nail salon, please have this removed prior to surgery. Artificial nails or gel coating may interfere with anesthesia's ability to adequately monitor your vital signs.  Wear Clean/Comfortable clothing the morning of surgery Remember to brush your teeth WITH YOUR REGULAR TOOTHPASTE.   Please read over the following fact sheets that you were given.    If you received a COVID test during your pre-op visit  it is requested that you wear a mask when out in public, stay away from anyone that may not be feeling well and notify your surgeon if you develop symptoms. If you have been in contact with anyone that has tested positive in the last 10 days please notify you surgeon.

## 2022-01-06 ENCOUNTER — Encounter (HOSPITAL_COMMUNITY)
Admission: RE | Admit: 2022-01-06 | Discharge: 2022-01-06 | Disposition: A | Discharge status: Home / Self Care | Payer: 59 | Source: Ambulatory Visit | Attending: Surgery | Admitting: Surgery

## 2022-01-06 ENCOUNTER — Encounter (HOSPITAL_COMMUNITY): Payer: Self-pay

## 2022-01-06 ENCOUNTER — Other Ambulatory Visit: Payer: Self-pay

## 2022-01-06 VITALS — BP 134/91 | HR 68 | Temp 97.4°F | Resp 17 | Ht 68.0 in | Wt 182.4 lb

## 2022-01-06 DIAGNOSIS — Z01818 Encounter for other preprocedural examination: Secondary | ICD-10-CM

## 2022-01-06 DIAGNOSIS — Z8489 Family history of other specified conditions: Secondary | ICD-10-CM | POA: Diagnosis not present

## 2022-01-06 DIAGNOSIS — Z01812 Encounter for preprocedural laboratory examination: Secondary | ICD-10-CM | POA: Insufficient documentation

## 2022-01-06 DIAGNOSIS — K429 Umbilical hernia without obstruction or gangrene: Secondary | ICD-10-CM | POA: Insufficient documentation

## 2022-01-06 HISTORY — DX: Malignant hyperthermia due to anesthesia, initial encounter: T88.3XXA

## 2022-01-06 HISTORY — DX: Family history of other specified conditions: Z84.89

## 2022-01-06 HISTORY — DX: Anxiety disorder, unspecified: F41.9

## 2022-01-06 LAB — CBC
HCT: 47.2 % (ref 39.0–52.0)
Hemoglobin: 15.7 g/dL (ref 13.0–17.0)
MCH: 30.8 pg (ref 26.0–34.0)
MCHC: 33.3 g/dL (ref 30.0–36.0)
MCV: 92.7 fL (ref 80.0–100.0)
Platelets: 251 10*3/uL (ref 150–400)
RBC: 5.09 MIL/uL (ref 4.22–5.81)
RDW: 12.6 % (ref 11.5–15.5)
WBC: 7.6 10*3/uL (ref 4.0–10.5)
nRBC: 0 % (ref 0.0–0.2)

## 2022-01-06 NOTE — Anesthesia Preprocedure Evaluation (Signed)
Anesthesia Evaluation  Patient identified by MRN, date of birth, ID band Patient awake    Reviewed: Allergy & Precautions, NPO status , Patient's Chart, lab work & pertinent test results  History of Anesthesia Complications (+) MALIGNANT HYPERTHERMIA, Family history of anesthesia reaction and history of anesthetic complications (sister w/ MH, pt has not been tested)  Airway Mallampati: II  TM Distance: >3 FB Neck ROM: Full    Dental  (+) Dental Advisory Given, Teeth Intact   Pulmonary neg pulmonary ROS   Pulmonary exam normal breath sounds clear to auscultation       Cardiovascular Normal cardiovascular exam Rhythm:Regular Rate:Normal     Neuro/Psych  PSYCHIATRIC DISORDERS Anxiety     negative neurological ROS     GI/Hepatic negative GI ROS, Neg liver ROS,,,  Endo/Other  negative endocrine ROS    Renal/GU negative Renal ROS  negative genitourinary   Musculoskeletal negative musculoskeletal ROS (+)    Abdominal   Peds  Hematology negative hematology ROS (+) Hb 15.7   Anesthesia Other Findings   Reproductive/Obstetrics negative OB ROS                             Anesthesia Physical Anesthesia Plan  ASA: 2  Anesthesia Plan: General   Post-op Pain Management: Tylenol PO (pre-op)* and Toradol IV (intra-op)*   Induction: Intravenous  PONV Risk Score and Plan: 3 and Propofol infusion, TIVA, Treatment may vary due to age or medical condition, Midazolam, Ondansetron and Dexamethasone  Airway Management Planned: Oral ETT  Additional Equipment: None  Intra-op Plan:   Post-operative Plan: Extubation in OR  Informed Consent: I have reviewed the patients History and Physical, chart, labs and discussed the procedure including the risks, benefits and alternatives for the proposed anesthesia with the patient or authorized representative who has indicated his/her understanding and acceptance.      Dental advisory given  Plan Discussed with: CRNA  Anesthesia Plan Comments: (Per pt sister w/ MH when much younger, but not sure if she was tested. Pt has only had non-triggering anesthetics in past. Has not been tested for [facility])       Anesthesia Quick Evaluation

## 2022-01-06 NOTE — Progress Notes (Signed)
PCP - [facility] Suezanne Jacquet, [place]) Cardiologist - Denies  PPM/ICD - Denies Device Orders - n/a Rep Notified - n/a  Chest x-ray - n/a EKG - 01/20/2021 - tracing requested Stress Test - Denies ECHO - Denies Cardiac Cath - Denies  Sleep Study - Denies CPAP - n/a  No DM  Last dose of GLP1 agonist- n/a GLP1 instructions: n/a  Blood Thinner Instructions: n/a Aspirin Instructions: n/a  ERAS Protcol - Clear liquids until 1130 morning of surgery PRE-SURGERY Ensure or G2- n/a. None ordered  COVID TEST- n/a   Anesthesia review: No. Pt has family hx of Malignant Hyperthermia (Sister 50+ years ago). Myra Gianotti, PA-C discussed with patient and anesthesiology team.   Patient denies shortness of breath, fever, cough and chest pain at PAT appointment   All instructions explained to the patient, with a verbal understanding of the material. Patient agrees to go over the instructions while at home for a better understanding. Patient also instructed to self quarantine after being tested for COVID-19. The opportunity to ask questions was provided.

## 2022-01-06 NOTE — Progress Notes (Signed)
Anesthesia Chart Review:   Case: [id number] Date/Time: 01/09/22 1415   Procedure: UMBILICAL HERNIA REPAIR, POSSIBLY WITH MESH   Anesthesia type: General   Pre-op diagnosis: UMBILICAL HERNIA   Location: [place] OR ROOM 02 / [place] [facility]   Surgeons: Stechschulte, Nickola Major, MD       DISCUSSION: Patient is a 61 year old male scheduled for the above procedure.   History includes never smoker, FAMILY HISTORY OF MALIGNANT HYPERTHERMIA (sister, 74 years ago, based on reaction during tonsillectomy), right THA (01/2021 Dr. Theda Sers, [facility]), nasal septoplasty/inferior turbinate reduction/tonsillectomy/LAUP (04/18/02), left inguinal hernia (laparoscopic left IHR 08/06/08).   He reported previous contact with [facility] at the Malignant Hyperthermia clinic, but was never contacted to follow through with muscle biopsy. He has had 7 prior surgeries and "trigger free" anesthesia has been utilized in the past. His surgery is scheduled for the afternoon as there was not a morning time readily available for his surgeon, but he was concerned because he had always been told he should be a first case so anesthesia lines could be flushed overnight. Confirmed with anesthesiologist Myrtie Soman, MD and discussed with Isaac Knight that filters can now be used and newer machines also able to flush more quickly, so he does not require a first case start.   He denied any cardiac issues. He is involved in [employer]. He tolerated TKR within the past year. Normal CBC & CMET 03/21/21. A1c 5.5 01/20/21.   Anesthesia team to evaluate on the day of surgery.   VS: BP (!) 134/91   Pulse 68   Temp (!) 36.3 C   Resp 17   Ht 5\' 8"  (1.727 m)   Wt 82.7 kg   SpO2 99%   BMI 27.73 kg/m   PROVIDERS: Group, [facility] is PCP    LABS: Labs reviewed: Acceptable for surgery. (all labs ordered are listed, but only abnormal results are displayed)  Labs Reviewed  CBC     EKG: EKG 01/10/21 (per Narrative  in [facility]): SB at 58 bpm. Requested.    CV: N/A  Past Medical History:  Diagnosis Date   Anxiety    Family history of adverse reaction to anesthesia    Malignant hyperthermia    Sister had it 50+ years ago    Past Surgical History:  Procedure Laterality Date   GANGLION CYST EXCISION  03/06/1996   left wrist   INGUINAL HERNIA REPAIR  03/06/2008   left   MEDIAL COLLATERAL LIGAMENT AND LATERAL COLLATERAL LIGAMENT REPAIR, ELBOW Left    MENISCUS REPAIR  2021   ROTATOR CUFF REPAIR  1988   left   SEPTOPLASTY  03/06/1998   TONSILLECTOMY  03/06/1998   TOTAL KNEE ARTHROPLASTY Right 2022   uvuloplasty  03/06/1998    MEDICATIONS:  APPLE CIDER VINEGAR PO   citalopram (CELEXA) 40 MG tablet   Magnesium 250 MG TABS   Multiple Vitamin (MULTIVITAMIN WITH MINERALS) TABS tablet   naphazoline-pheniramine (EYE ALLERGY RELIEF) 0.025-0.3 % ophthalmic solution   TURMERIC-GINGER PO   No current facility-administered medications for this encounter.    Isaac Gianotti, PA-C Surgical Short Stay/Anesthesiology [facility] Phone [phone] [facility] Phone [phone] 01/06/2022 3:52 PM

## 2022-01-09 ENCOUNTER — Ambulatory Visit (HOSPITAL_COMMUNITY): Payer: 59 | Admitting: Vascular Surgery

## 2022-01-09 ENCOUNTER — Ambulatory Visit (HOSPITAL_BASED_OUTPATIENT_CLINIC_OR_DEPARTMENT_OTHER): Payer: 59 | Admitting: Certified Registered Nurse Anesthetist

## 2022-01-09 ENCOUNTER — Ambulatory Visit (HOSPITAL_COMMUNITY)
Admission: RE | Admit: 2022-01-09 | Discharge: 2022-01-09 | Disposition: A | Discharge status: Home / Self Care | Payer: 59 | Attending: Surgery | Admitting: Surgery

## 2022-01-09 ENCOUNTER — Encounter (HOSPITAL_COMMUNITY): Payer: Self-pay | Admitting: Surgery

## 2022-01-09 ENCOUNTER — Encounter (HOSPITAL_COMMUNITY)
Admission: RE | Disposition: A | Discharge status: Home / Self Care | Payer: Self-pay | Source: Home / Self Care | Attending: Surgery

## 2022-01-09 DIAGNOSIS — K429 Umbilical hernia without obstruction or gangrene: Secondary | ICD-10-CM

## 2022-01-09 HISTORY — PX: UMBILICAL HERNIA REPAIR: SHX196

## 2022-01-09 SURGERY — REPAIR, HERNIA, UMBILICAL, ADULT
Anesthesia: General

## 2022-01-09 MED ORDER — KETOROLAC TROMETHAMINE 30 MG/ML IJ SOLN
15.0000 mg | Freq: Once | INTRAMUSCULAR | Status: AC | PRN
  Administered 2022-01-09: 15 mg via INTRAVENOUS

## 2022-01-09 MED ORDER — CHLORHEXIDINE GLUCONATE CLOTH 2 % EX PADS: 6.0000 | MEDICATED_PAD | Freq: Once | CUTANEOUS | Status: DC

## 2022-01-09 MED ORDER — HYDROMORPHONE HCL 1 MG/ML IJ SOLN
INTRAMUSCULAR | Status: AC
  Filled 2022-01-09: qty 1

## 2022-01-09 MED ORDER — ORAL CARE MOUTH RINSE: 15.0000 mL | Freq: Once | OROMUCOSAL | Status: AC

## 2022-01-09 MED ORDER — KETOROLAC TROMETHAMINE 30 MG/ML IJ SOLN
INTRAMUSCULAR | Status: AC
  Filled 2022-01-09: qty 1

## 2022-01-09 MED ORDER — OXYCODONE HCL 5 MG/5ML PO SOLN: 5.0000 mg | Freq: Once | ORAL | Status: DC | PRN

## 2022-01-09 MED ORDER — PROPOFOL 10 MG/ML IV BOLUS
INTRAVENOUS | Status: DC | PRN
  Administered 2022-01-09: 150 mg via INTRAVENOUS

## 2022-01-09 MED ORDER — BUPIVACAINE-EPINEPHRINE (PF) 0.25% -1:200000 IJ SOLN
INTRAMUSCULAR | Status: AC
  Filled 2022-01-09: qty 30

## 2022-01-09 MED ORDER — ROCURONIUM BROMIDE 10 MG/ML (PF) SYRINGE
PREFILLED_SYRINGE | INTRAVENOUS | Status: AC
  Filled 2022-01-09: qty 10

## 2022-01-09 MED ORDER — LACTATED RINGERS IV SOLN: INTRAVENOUS | Status: DC

## 2022-01-09 MED ORDER — 0.9 % SODIUM CHLORIDE (POUR BTL) OPTIME
TOPICAL | Status: DC | PRN
  Administered 2022-01-09: 1000 mL

## 2022-01-09 MED ORDER — DEXAMETHASONE SODIUM PHOSPHATE 10 MG/ML IJ SOLN
INTRAMUSCULAR | Status: AC
  Filled 2022-01-09: qty 1

## 2022-01-09 MED ORDER — ONDANSETRON HCL 4 MG/2ML IJ SOLN
INTRAMUSCULAR | Status: DC | PRN
  Administered 2022-01-09: 4 mg via INTRAVENOUS

## 2022-01-09 MED ORDER — LIDOCAINE HCL (PF) 2 % IJ SOLN
INTRAMUSCULAR | Status: AC
  Filled 2022-01-09: qty 10

## 2022-01-09 MED ORDER — SUGAMMADEX SODIUM 200 MG/2ML IV SOLN
INTRAVENOUS | Status: DC | PRN
  Administered 2022-01-09: 200 mg via INTRAVENOUS

## 2022-01-09 MED ORDER — LIDOCAINE 2% (20 MG/ML) 5 ML SYRINGE
INTRAMUSCULAR | Status: DC | PRN
  Administered 2022-01-09: 60 mg via INTRAVENOUS
  Administered 2022-01-09: 1.5 mg/kg/h via INTRAVENOUS

## 2022-01-09 MED ORDER — CHLORHEXIDINE GLUCONATE 0.12 % MT SOLN
15.0000 mL | Freq: Once | OROMUCOSAL | Status: AC
  Administered 2022-01-09: 15 mL via OROMUCOSAL

## 2022-01-09 MED ORDER — BUPIVACAINE LIPOSOME 1.3 % IJ SUSP: 20.0000 mL | Freq: Once | INTRAMUSCULAR | Status: DC

## 2022-01-09 MED ORDER — AMISULPRIDE (ANTIEMETIC) 5 MG/2ML IV SOLN
INTRAVENOUS | Status: AC
  Filled 2022-01-09: qty 4

## 2022-01-09 MED ORDER — ACETAMINOPHEN 500 MG PO TABS
1000.0000 mg | ORAL_TABLET | ORAL | Status: AC
  Administered 2022-01-09: 1000 mg via ORAL
  Filled 2022-01-09: qty 2

## 2022-01-09 MED ORDER — MIDAZOLAM HCL 2 MG/2ML IJ SOLN
INTRAMUSCULAR | Status: AC
  Filled 2022-01-09: qty 2

## 2022-01-09 MED ORDER — OXYCODONE HCL 5 MG PO TABS: 5.0000 mg | ORAL_TABLET | Freq: Once | ORAL | Status: DC | PRN

## 2022-01-09 MED ORDER — PROPOFOL 10 MG/ML IV BOLUS
INTRAVENOUS | Status: AC
  Filled 2022-01-09: qty 20

## 2022-01-09 MED ORDER — PROPOFOL 1000 MG/100ML IV EMUL
INTRAVENOUS | Status: AC
  Filled 2022-01-09: qty 100

## 2022-01-09 MED ORDER — GABAPENTIN 300 MG PO CAPS
300.0000 mg | ORAL_CAPSULE | ORAL | Status: AC
  Administered 2022-01-09: 300 mg via ORAL
  Filled 2022-01-09: qty 1

## 2022-01-09 MED ORDER — CEFAZOLIN SODIUM-DEXTROSE 2-4 GM/100ML-% IV SOLN
2.0000 g | INTRAVENOUS | Status: AC
  Administered 2022-01-09: 2 g via INTRAVENOUS
  Filled 2022-01-09: qty 100

## 2022-01-09 MED ORDER — AMISULPRIDE (ANTIEMETIC) 5 MG/2ML IV SOLN
10.0000 mg | Freq: Once | INTRAVENOUS | Status: AC | PRN
  Administered 2022-01-09: 10 mg via INTRAVENOUS

## 2022-01-09 MED ORDER — HYDROMORPHONE HCL 1 MG/ML IJ SOLN
0.2500 mg | INTRAMUSCULAR | Status: DC | PRN
  Administered 2022-01-09 (×2): 0.5 mg via INTRAVENOUS

## 2022-01-09 MED ORDER — PROPOFOL 500 MG/50ML IV EMUL
INTRAVENOUS | Status: DC | PRN
  Administered 2022-01-09: 200 ug/kg/min via INTRAVENOUS

## 2022-01-09 MED ORDER — OXYCODONE-ACETAMINOPHEN 5-325 MG PO TABS: 1.0000 | ORAL_TABLET | ORAL | 0 refills | Status: AC | PRN

## 2022-01-09 MED ORDER — ROCURONIUM BROMIDE 10 MG/ML (PF) SYRINGE
PREFILLED_SYRINGE | INTRAVENOUS | Status: DC | PRN
  Administered 2022-01-09: 60 mg via INTRAVENOUS

## 2022-01-09 MED ORDER — ONDANSETRON HCL 4 MG/2ML IJ SOLN
INTRAMUSCULAR | Status: AC
  Filled 2022-01-09: qty 2

## 2022-01-09 MED ORDER — KETAMINE HCL 10 MG/ML IJ SOLN
INTRAMUSCULAR | Status: DC | PRN
  Administered 2022-01-09 (×3): 10 mg via INTRAVENOUS
  Administered 2022-01-09: 20 mg via INTRAVENOUS

## 2022-01-09 MED ORDER — DEXAMETHASONE SODIUM PHOSPHATE 10 MG/ML IJ SOLN
INTRAMUSCULAR | Status: DC | PRN
  Administered 2022-01-09: 10 mg via INTRAVENOUS

## 2022-01-09 MED ORDER — BUPIVACAINE-EPINEPHRINE 0.25% -1:200000 IJ SOLN
INTRAMUSCULAR | Status: DC | PRN
  Administered 2022-01-09: 30 mL

## 2022-01-09 MED ORDER — FENTANYL CITRATE (PF) 100 MCG/2ML IJ SOLN
INTRAMUSCULAR | Status: AC
  Filled 2022-01-09: qty 2

## 2022-01-09 MED ORDER — ONDANSETRON HCL 4 MG/2ML IJ SOLN: 4.0000 mg | Freq: Once | INTRAMUSCULAR | Status: DC | PRN

## 2022-01-09 SURGICAL SUPPLY — 25 items
ADH SKN CLS APL DERMABOND .7 (GAUZE/BANDAGES/DRESSINGS) ×1
BALL CTTN LRG ABS STRL LF (GAUZE/BANDAGES/DRESSINGS)
COTTONBALL LRG STERILE PKG (GAUZE/BANDAGES/DRESSINGS) ×1 IMPLANT
COVER SURGICAL LIGHT HANDLE (MISCELLANEOUS) ×1 IMPLANT
DERMABOND ADVANCED .7 DNX12 (GAUZE/BANDAGES/DRESSINGS) ×1 IMPLANT
DRAPE LAPAROTOMY T 98X78 PEDS (DRAPES) ×1 IMPLANT
DRSG TEGADERM 4X4.75 (GAUZE/BANDAGES/DRESSINGS) ×1 IMPLANT
ELECT REM PT RETURN 15FT ADLT (MISCELLANEOUS) ×1 IMPLANT
GLOVE BIOGEL PI IND STRL 8 (GLOVE) ×1 IMPLANT
GOWN STRL REUS W/ TWL XL LVL3 (GOWN DISPOSABLE) ×1 IMPLANT
GOWN STRL REUS W/TWL XL LVL3 (GOWN DISPOSABLE) ×1
KIT BASIN OR (CUSTOM PROCEDURE TRAY) ×1 IMPLANT
KIT TURNOVER KIT A (KITS) IMPLANT
NEEDLE HYPO 22GX1.5 SAFETY (NEEDLE) ×1 IMPLANT
NS IRRIG 1000ML POUR BTL (IV SOLUTION) ×1 IMPLANT
PACK GENERAL/GYN (CUSTOM PROCEDURE TRAY) ×1 IMPLANT
SPIKE FLUID TRANSFER (MISCELLANEOUS) ×1 IMPLANT
SUT MNCRL AB 4-0 PS2 18 (SUTURE) ×1 IMPLANT
SUT NOVA NAB GS-21 0 18 T12 DT (SUTURE) IMPLANT
SUT PDS AB 2-0 CT2 27 (SUTURE) IMPLANT
SUT PROLENE 0 CT 1 30 (SUTURE) IMPLANT
SUT VIC AB 3-0 SH 8-18 (SUTURE) ×1 IMPLANT
SYR CONTROL 10ML LL (SYRINGE) ×1 IMPLANT
TOWEL OR 17X26 10 PK STRL BLUE (TOWEL DISPOSABLE) ×1 IMPLANT
TRAY FOLEY MTR SLVR 16FR STAT (SET/KITS/TRAYS/PACK) IMPLANT

## 2022-01-09 NOTE — Anesthesia Postprocedure Evaluation (Signed)
Anesthesia Post Note  Patient: Isaac Knight  Procedure(s) Performed: UMBILICAL HERNIA REPAIR     Patient location during evaluation: PACU Anesthesia Type: General Level of consciousness: awake and alert Pain management: pain level controlled Vital Signs Assessment: post-procedure vital signs reviewed and stable Respiratory status: spontaneous breathing, nonlabored ventilation and respiratory function stable Cardiovascular status: blood pressure returned to baseline and stable Postop Assessment: no apparent nausea or vomiting Anesthetic complications: no   No notable events documented.  Last Vitals:  Vitals:   01/09/22 1515 01/09/22 1530  BP: 131/81 127/75  Pulse: (!) 105 74  Resp: 17 19  Temp:    SpO2: 97% 91%    Last Pain:  Vitals:   01/09/22 1530  TempSrc:   PainSc: [place]

## 2022-01-09 NOTE — H&P (Signed)
   Admitting Physician: Nickola Major Coltan Spinello  Service: General surgery  CC: Umbilical hernia  Subjective   HPI: Isaac Knight is an 61 y.o. male who is here for umbilical hernia repair  Past Medical History:  Diagnosis Date   Anxiety    Family history of adverse reaction to anesthesia    Malignant hyperthermia    Sister had it 50+ years ago    Past Surgical History:  Procedure Laterality Date   GANGLION CYST EXCISION  03/06/1996   left wrist   INGUINAL HERNIA REPAIR  03/06/2008   left   MEDIAL COLLATERAL LIGAMENT AND LATERAL COLLATERAL LIGAMENT REPAIR, ELBOW Left    MENISCUS REPAIR  2021   ROTATOR CUFF REPAIR  1988   left   SEPTOPLASTY  03/06/1998   TONSILLECTOMY  03/06/1998   TOTAL KNEE ARTHROPLASTY Right 2022   uvuloplasty  03/06/1998    Family History  Problem Relation Age of Onset   Malignant hyperthermia Sister     Social:  reports that he has never smoked. He has never used smokeless tobacco. He reports current alcohol use. He reports that he does not use drugs.  Allergies: No Known Allergies  Medications: Current Outpatient Medications  Medication Instructions   APPLE CIDER VINEGAR PO 2 tablets, Oral, Daily, Goli Apple Cider Vinegar Gummy Vitamins<BR><BR>   citalopram (CELEXA) 40 mg, Oral, Daily   Magnesium 250 mg, Oral, Daily   Multiple Vitamin (MULTIVITAMIN WITH MINERALS) TABS tablet 1 tablet, Oral, 2 times weekly   naphazoline-pheniramine (EYE ALLERGY RELIEF) 0.025-0.3 % ophthalmic solution 1-2 drops, 4 times daily PRN   TURMERIC-GINGER PO 2 tablets, Oral, Daily, Gummies    ROS - all of the below systems have been reviewed with the patient and positives are indicated with bold text General: chills, fever or night sweats Eyes: blurry vision or double vision ENT: epistaxis or sore throat Allergy/Immunology: itchy/watery eyes or nasal congestion Hematologic/Lymphatic: bleeding problems, blood clots or swollen lymph nodes Endocrine:  temperature intolerance or unexpected weight changes Breast: new or changing breast lumps or nipple discharge Resp: cough, shortness of breath, or wheezing CV: chest pain or dyspnea on exertion GI: as per HPI GU: dysuria, trouble voiding, or hematuria MSK: joint pain or joint stiffness Neuro: TIA or stroke symptoms Derm: pruritus and skin lesion changes Psych: anxiety and depression  Objective   PE There were no vitals taken for this visit. Constitutional: NAD; conversant; no deformities Eyes: Moist conjunctiva; no lid lag; anicteric; PERRL Neck: Trachea midline; no thyromegaly Lungs: Normal respiratory effort; no tactile fremitus CV: RRR; no palpable thrills; no pitting edema GI: Abd Umbilical hernia; no palpable hepatosplenomegaly MSK: Normal range of motion of extremities; no clubbing/cyanosis Psychiatric: Appropriate affect; alert and oriented x3 Lymphatic: No palpable cervical or axillary lymphadenopathy  No results found for this or any previous visit (from the past 24 hour(s)).  Imaging Orders  No imaging studies ordered today     Assessment and Plan   Isaac Knight is an 61 y.o. male with an umbilical hernia.  I recommended umbilical hernia repair, possibly with mesh.  The procedure, its risks, benefits and alternatives were discussed and the patient granted consent to proceed.  We will proceed as scheduled.  The patient's sister had malignant hyperthermia after anesthesia. Mr. Yankowski has had many surgeries, and has never had issues with anesthesia.    Felicie Morn, MD  [facility] Use [email or web address] to contact on call provider

## 2022-01-09 NOTE — Discharge Instructions (Signed)
 VENTRAL HERNIA REPAIR POST OPERATIVE INSTRUCTIONS  Thinking Clearly  The anesthesia may cause you to feel different for 1 or 2 days. Do not drive, drink alcohol, or make any big decisions for at least 2 days.  Nutrition When you wake up, you will be able to drink small amounts of liquid. If you do not feel sick, you can slowly advance your diet to regular foods. Continue to drink lots of fluids, usually about 8 to 10 glasses per day. Eat a high-fiber diet so you don't strain during bowel movements. High-Fiber Foods Foods high in fiber include beans, bran cereals and whole-grain breads, peas, dried fruit (figs, apricots, and dates), raspberries, blackberries, strawberries, sweet corn, broccoli, baked potatoes with skin, plums, pears, apples, greens, and nuts. Activity Slowly increase your activity. Be sure to get up and walk every hour or so to prevent blood clots. No heavy lifting or strenuous activity for 4 weeks following surgery to prevent hernias at your incision sites or recurrence of your hernia. It is normal to feel tired. You may need more sleep than usual.  Get your rest but make sure to get up and move around frequently to prevent blood clots and pneumonia.  Work and Return to School You can go back to work when you feel well enough. Discuss the timing with your surgeon. You can usually go back to school or work 1 week or less after an laparoscopic or an open repair. If your work requires heavy lifting or strenuous activity you need to be placed on light duty for 4 weeks following surgery. You can return to gym class, sports or other physical activities 4 weeks after surgery.  Wound Care You may experience significant bruising throughout the abdominal wall that may track down into the groin including into the scrotum in males.  Rest, elevating the groin and scrotum above the level of the heart, ice and compression with tight fitting underwear or an abdominal binder can help.   Always wash your hands before and after touching near your incision site. Do not soak in a bathtub until cleared at your follow up appointment. You may take a shower 24 hours after surgery. A small amount of drainage from the incision is normal. If the drainage is thick and yellow or the site is red, you may have an infection, so call your surgeon. If you have a drain in one of your incisions, it will be taken out in office when the drainage stops. Steri-Strips will fall off in 7 to 10 days or they will be removed during your first office visit. If you have dermabond glue covering over the incision, allow the glue to flake off on its own. Protect the new skin, especially from the sun. The sun can burn and cause darker scarring. Your scar will heal in about 4 to 6 weeks and will become softer and continue to fade over the next year.  The cosmetic appearance of the incisions will improve over the course of the first year after surgery. Sensation around your incision will return in a few weeks or months.  Bowel Movements After intestinal surgery, you may have loose watery stools for several days. If watery diarrhea lasts longer than 3 days, contact your surgeon. Pain medication (narcotics) can cause constipation. Increase the fiber in your diet with high-fiber foods if you are constipated. You can take an over the counter stool softener like Colace to avoid constipation.  Additional over the counter medications can also be used   if Colace isn't sufficient (for example, Milk of Magnesia or Miralax).  Pain The amount of pain is different for each person. Some people need only 1 to 3 doses of pain control medication, while others need more. Take alternating doses of tylenol and ibuprofen around the clock for the first five days following surgery.  This will provide a baseline of pain control and help with inflammation.  Take the narcotic pain medication in addition if needed for severe pain.  Contact  Your Surgeon at [phone], if you have: Pain that will not go away Pain that gets worse A fever of more than 101F (38.3C) Repeated vomiting Swelling, redness, bleeding, or bad-smelling drainage from your wound site Strong abdominal pain No bowel movement or unable to pass gas for 3 days Watery diarrhea lasting longer than 3 days  Pain Control The goal of pain control is to minimize pain, keep you moving and help you heal. Your surgical team will work with you on your pain plan. Most often a combination of therapies and medications are used to control your pain. You may also be given medication (local anesthetic) at the surgical site. This may help control your pain for several days. Extreme pain puts extra stress on your body at a time when your body needs to focus on healing. Do not wait until your pain has reached a level "10" or is unbearable before telling your doctor or nurse. It is much easier to control pain before it becomes severe. Following a laparoscopic procedure, pain is sometimes felt in the shoulder. This is due to the gas inserted into your abdomen during the procedure. Moving and walking helps to decrease the gas and the right shoulder pain.  Use the guide below for ways to manage your post-operative pain. Learn more by going to facs.org/safepaincontrol.  How Intense Is My Pain Common Therapies to Feel Better       I hardly notice my pain, and it does not interfere with my activities.  I notice my pain and it distracts me, but I can still do activities (sitting up, walking, standing).  Non-Medication Therapies  Ice (in a bag, applied over clothing at the surgical site), elevation, rest, meditation, massage, distraction (music, TV, play) walking and mild exercise Splinting the abdomen with pillows +  Non-Opioid Medications Acetaminophen (Tylenol) Non-steroidal anti-inflammatory drugs (NSAIDS) Aspirin, Ibuprofen (Motrin, Advil) Naproxen (Aleve) Take these as  needed, when you feel pain. Both acetaminophen and NSAIDs help to decrease pain and swelling (inflammation).      My pain is hard to ignore and is more noticeable even when I rest.  My pain interferes with my usual activities.  Non-Medication Therapies  +  Non-Opioid medications  Take on a regular schedule (around-the-clock) instead of as needed. (For example, Tylenol every 6 hours at 9:00 am, 3:00 pm, 9:00 pm, 3:00 am and Motrin every 6 hours at 12:00 am, 6:00 am, 12:00 pm, 6:00 pm)         I am focused on my pain, and I am not doing my daily activities.  I am groaning in pain, and I cannot sleep. I am unable to do anything.  My pain is as bad as it could be, and nothing else matters.  Non-Medication Therapies  +  Around-the-Clock Non-Opioid Medications  +  Short-acting opioids  Opioids should be used with other medications to manage severe pain. Opioids block pain and give a feeling of euphoria (feel high). Addiction, a serious side effect of opioids, is   rare with short-term (a few days) use.  Examples of short-acting opioids include: Tramadol (Ultram), Hydrocodone (Norco, Vicodin), Hydromorphone (Dilaudid), Oxycodone (Oxycontin)     The above directions have been adapted from the American College of [employer]geons Surgical Patient Education Program.  Please refer to the ACS website if needed: https://www.facs.org/-/media/files/education/patient-ed/[email or web address].ashx   Jomari Tola Meas, MD [facility] [address], [address], [place], [place]  [address] ?  [address] [address], [place], [place]   [address] [phone] ? [phone] ? FAX [phone] Web site: [email or web address]  

## 2022-01-09 NOTE — Op Note (Signed)
   Patient: Isaac Knight (1960/11/09, [id number])  Date of Surgery: 01/09/2022   Preoperative Diagnosis: UMBILICAL HERNIA   Postoperative Diagnosis: UMBILICAL HERNIA   Surgical Procedure: UMBILICAL HERNIA REPAIR    Operative Team Members:  Surgeon(s) and Role:    * Shadara Lopez, Nickola Major, MD - Primary   Anesthesiologist: Pervis Hocking, DO CRNA: Marijo Conception, CRNA   Anesthesia: General   Fluids:  No intake/output data recorded.  Complications: None  Drains:  none   Specimen: None  Disposition:  PACU - hemodynamically stable.  Plan of Care: Discharge to home after PACU    Indications for Procedure: Isaac Knight is a 61 y.o. male who presented with an umbilical hernia.  I recommended repair.  The procedure, its risks, benefits and alternatives were discussed and the patient granted consent to proceed.  Findings:  Hernia Location: Umbilical umbilical subxiphoid epigastric infraumbilical suprapubic  Hernia Size:  1 x 1 cm Multiple hernia defects, each smaller than 0.5cm in a 1x1cm area  Mesh Size &Type:  none Mesh Position: n/a  Description of Procedure:  The patient was positioned supine, padded and secured to the bed.  The abdomen was widely prepped and draped.  A time out procedure was performed.    A curvilinear incision was made below the umbilicus and dissection was carried down through the subcutaneous tissue to the level of the fascia.  The umbilical stalk was encircled and the hernia sac amputated off the umbilical skin.  The hernia defect was measured as 1 cm wide by 1 cm in vertical dimension and involved multiple very small defects.    A sutured umbilical hernia repair was performed utilizing multiple 0-prolene sutures. The defect was closed horizontally under no tension.  The wound was irrigated with saline.  The umbilical skin was tacked down to the fascial repair with vicryl suture.  The subcutaneous layer was closed with vicryl  suture.  The skin was closed with 4-0 Monocryl subcuticular suture and skin glue.    Louanna Raw, MD General, Bariatric, & Minimally Invasive Surgery [facility] [place]

## 2022-01-09 NOTE — Transfer of Care (Signed)
Immediate Anesthesia Transfer of Care Note  Patient: Isaac Knight  Procedure(s) Performed: UMBILICAL HERNIA REPAIR  Patient Location: PACU  Anesthesia Type:General  Level of Consciousness: awake, alert , and oriented  Airway & Oxygen Therapy: Patient Spontanous Breathing and Patient connected to face mask oxygen  Post-op Assessment: Report given to RN and Post -op Vital signs reviewed and stable  Post vital signs: Reviewed and stable  Last Vitals:  Vitals Value Taken Time  BP 130/80 01/09/22 1447  Temp    Pulse 112 01/09/22 1449  Resp 23 01/09/22 1449  SpO2 94 % 01/09/22 1449  Vitals shown include unvalidated device data.  Last Pain:  Vitals:   01/09/22 1257  TempSrc:   PainSc: 0-No pain         Complications: No notable events documented.

## 2022-01-09 NOTE — Anesthesia Procedure Notes (Signed)
Procedure Name: Intubation Date/Time: 01/09/2022 1:43 PM  Performed by: Donelle Hise D, CRNAPre-anesthesia Checklist: Patient identified, Emergency Drugs available, Suction available and Patient being monitored Patient Re-evaluated:Patient Re-evaluated prior to induction Oxygen Delivery Method: Circle system utilized Preoxygenation: Pre-oxygenation with 100% oxygen Induction Type: IV induction Ventilation: Mask ventilation without difficulty Laryngoscope Size: Mac and 4 Grade View: Grade I Tube type: Oral Number of attempts: 1 Airway Equipment and Method: Stylet Placement Confirmation: ETT inserted through vocal cords under direct vision, positive ETCO2 and breath sounds checked- equal and bilateral Secured at: 22 cm Tube secured with: Tape Dental Injury: Teeth and Oropharynx as per pre-operative assessment

## 2022-01-10 ENCOUNTER — Encounter (HOSPITAL_COMMUNITY): Payer: Self-pay | Admitting: Surgery

## 2024-01-24 ENCOUNTER — Ambulatory Visit
Admission: RE | Admit: 2024-01-24 | Discharge: 2024-01-24 | Disposition: A | Discharge status: Home / Self Care | Source: Ambulatory Visit | Attending: Orthopedic Surgery | Admitting: Orthopedic Surgery

## 2024-01-24 ENCOUNTER — Other Ambulatory Visit: Payer: Self-pay | Admitting: Orthopedic Surgery

## 2024-01-24 DIAGNOSIS — Z96651 Presence of right artificial knee joint: Secondary | ICD-10-CM

## 2024-03-03 ENCOUNTER — Ambulatory Visit: Payer: Self-pay | Admitting: Emergency Medicine

## 2024-03-03 DIAGNOSIS — M25661 Stiffness of right knee, not elsewhere classified: Secondary | ICD-10-CM

## 2024-03-03 DIAGNOSIS — G8929 Other chronic pain: Secondary | ICD-10-CM

## 2024-03-03 NOTE — H&P (Signed)
 TOTAL KNEE REVISION ADMISSION H&P  Patient is being admitted for right revision total knee arthroplasty.  Subjective:  Chief Complaint:right knee pain.  HPI: Isaac Knight, 63 y.o. male, has a history of pain and functional disability in the right knee(s) due to post-operative tightness and pain and patient has failed non-surgical conservative treatments for greater than 12 weeks to include NSAID's and/or analgesics, flexibility and strengthening excercises, supervised PT with diminished ADL's post treatment, use of assistive devices, and activity modification. The indications for the revision of the total knee arthroplasty are post-operative tightness with flexion contracture and chronic pain following knee replacement. Onset of symptoms was gradual starting 2-3 years ago following the knee replacement, with gradual worsening in that time.  Prior procedures on the right knee(s) include arthroplasty.  Patient currently rates pain in the right knee(s) at 8 out of 10 with activity. There is night pain, worsening of pain with activity and weight bearing, pain that interferes with activities of daily living, and pain with passive range of motion.  Patient has evidence of components in adequate position by imaging studies. This condition presents safety issues increasing the risk of falls.  There is no current active infection.  There are no active problems to display for this patient.  Past Medical History:  Diagnosis Date   Anxiety    Family history of adverse reaction to anesthesia    Malignant hyperthermia    Sister had it 50+ years ago    Past Surgical History:  Procedure Laterality Date   GANGLION CYST EXCISION  03/06/1996   left wrist   INGUINAL HERNIA REPAIR  03/06/2008   left   MEDIAL COLLATERAL LIGAMENT AND LATERAL COLLATERAL LIGAMENT REPAIR, ELBOW Left    MENISCUS REPAIR  2021   ROTATOR CUFF REPAIR  1988   left   SEPTOPLASTY  03/06/1998   TONSILLECTOMY  03/06/1998   TOTAL  KNEE ARTHROPLASTY Right 2022   UMBILICAL HERNIA REPAIR N/A 01/09/2022   Procedure: UMBILICAL HERNIA REPAIR;  Surgeon: Lyndel Deward PARAS, MD;  Location: [facility];  Service: General;  Laterality: N/A;   uvuloplasty  03/06/1998    Current Outpatient Medications  Medication Sig Dispense Refill Last Dose/Taking   APPLE CIDER VINEGAR PO Take 2 tablets by mouth daily. Goli Apple Cider Vinegar Gummy Vitamins      citalopram (CELEXA) 40 MG tablet Take 40 mg by mouth daily.      Magnesium 250 MG TABS Take 250 mg by mouth daily.      Multiple Vitamin (MULTIVITAMIN WITH MINERALS) TABS tablet Take 1 tablet by mouth 2 (two) times a week.      naphazoline-pheniramine (EYE ALLERGY RELIEF) 0.025-0.3 % ophthalmic solution 1-2 drops 4 (four) times daily as needed for eye irritation.      TURMERIC-GINGER PO Take 2 tablets by mouth daily. Gummies      No current facility-administered medications for this visit.   Allergies[1]  Social History   Tobacco Use   Smoking status: Never   Smokeless tobacco: Never  Substance Use Topics   Alcohol use: Yes    Comment: occasionally    Family History  Problem Relation Age of Onset   Malignant hyperthermia Sister       Review of Systems  Musculoskeletal:  Positive for arthralgias.  All other systems reviewed and are negative.    Objective:  Physical Exam Constitutional:      General: He is not in acute distress.    Appearance: Normal appearance. He is normal weight.  HENT:     Head: Normocephalic and atraumatic.  Eyes:     Extraocular Movements: Extraocular movements intact.     Conjunctiva/sclera: Conjunctivae normal.     Pupils: Pupils are equal, round, and reactive to light.  Cardiovascular:     Rate and Rhythm: Normal rate and regular rhythm.     Pulses: Normal pulses.  Pulmonary:     Effort: Pulmonary effort is normal. No respiratory distress.  Abdominal:     General: There is no distension.     Palpations: Abdomen is soft.   Musculoskeletal:        General: Tenderness present.     Cervical back: Normal range of motion and neck supple.     Comments: TTP over medial and lateral joint line, medial worse than lateral.  No calf tenderness, swelling, or erythema.  Well healed vertical incision, otherwise no overlying lesions of area of chief complaint.  Decreased strength and ROM due to elicited pain.  Pre-operative ROM extension no less than 10 degrees.  Dorsiflexion and plantarflexion intact.  Stable to varus and valgus stress.  BLE appear grossly neurovascularly intact.  Gait mildly antalgic.   Skin:    General: Skin is warm and dry.     Capillary Refill: Capillary refill takes less than 2 seconds.     Findings: No erythema or rash.  Neurological:     General: No focal deficit present.     Mental Status: He is alert and oriented to person, place, and time.  Psychiatric:        Mood and Affect: Mood normal.        Behavior: Behavior normal.     Vital signs in last 24 hours: @VSRANGES @  Labs:  Estimated body mass index is 27.72 kg/m as calculated from the following:   Height as of 01/09/22: 5' 8 (1.727 m).   Weight as of 01/09/22: 82.7 kg.  Imaging Review Plain radiographs demonstrate total knee arthroplasty components of the left knee(s) in adequate position without adverse features. The overall alignment is neutral.There is no significant evidence of loosening of the femoral and tibial components. The bone quality appears to be fair for age and reported activity level.     Assessment/Plan:  Post-operative stiffness and pain, flexion contracture, right knee(s) with failed previous arthroplasty.   The patient history, physical examination, clinical judgment of the provider and imaging studies are consistent with Post-operative stiffness and pain, flexion contracture of the right knee(s), previous total knee arthroplasty. Revision total knee arthroplasty, specifically poly exchange, is deemed medically  necessary. The treatment options including medical management, injection therapy, arthroscopy and revision arthroplasty were discussed at length. The risks and benefits of revision total knee arthroplasty were presented and reviewed. The risks due to aseptic loosening, infection, stiffness, patella tracking problems, thromboembolic complications and other imponderables were discussed. This also included the possibility of transitioning plan from poly exchange to include but not limited to full total knee revision.  The patient acknowledged the explanation, agreed to proceed with the plan and consent was signed. Patient is being admitted for inpatient treatment for surgery, pain control, PT, OT, prophylactic antibiotics, VTE prophylaxis, progressive ambulation and ADL's and discharge planning.The patient is planning to be discharged home with OPPT      [1] No Known Allergies

## 2024-03-03 NOTE — H&P (View-Only) (Signed)
 TOTAL KNEE REVISION ADMISSION H&P  Patient is being admitted for right revision total knee arthroplasty.  Subjective:  Chief Complaint:right knee pain.  HPI: Isaac Knight, 63 y.o. male, has a history of pain and functional disability in the right knee(s) due to post-operative tightness and pain and patient has failed non-surgical conservative treatments for greater than 12 weeks to include NSAID's and/or analgesics, flexibility and strengthening excercises, supervised PT with diminished ADL's post treatment, use of assistive devices, and activity modification. The indications for the revision of the total knee arthroplasty are post-operative tightness with flexion contracture and chronic pain following knee replacement. Onset of symptoms was gradual starting 2-3 years ago following the knee replacement, with gradual worsening in that time.  Prior procedures on the right knee(s) include arthroplasty.  Patient currently rates pain in the right knee(s) at 8 out of 10 with activity. There is night pain, worsening of pain with activity and weight bearing, pain that interferes with activities of daily living, and pain with passive range of motion.  Patient has evidence of components in adequate position by imaging studies. This condition presents safety issues increasing the risk of falls.  There is no current active infection.  There are no active problems to display for this patient.  Past Medical History:  Diagnosis Date   Anxiety    Family history of adverse reaction to anesthesia    Malignant hyperthermia    Sister had it 50+ years ago    Past Surgical History:  Procedure Laterality Date   GANGLION CYST EXCISION  03/06/1996   left wrist   INGUINAL HERNIA REPAIR  03/06/2008   left   MEDIAL COLLATERAL LIGAMENT AND LATERAL COLLATERAL LIGAMENT REPAIR, ELBOW Left    MENISCUS REPAIR  2021   ROTATOR CUFF REPAIR  1988   left   SEPTOPLASTY  03/06/1998   TONSILLECTOMY  03/06/1998   TOTAL  KNEE ARTHROPLASTY Right 2022   UMBILICAL HERNIA REPAIR N/A 01/09/2022   Procedure: UMBILICAL HERNIA REPAIR;  Surgeon: Lyndel Deward PARAS, MD;  Location: [facility];  Service: General;  Laterality: N/A;   uvuloplasty  03/06/1998    Current Outpatient Medications  Medication Sig Dispense Refill Last Dose/Taking   APPLE CIDER VINEGAR PO Take 2 tablets by mouth daily. [place] Apple Cider Vinegar Gummy Vitamins      citalopram (CELEXA) 40 MG tablet Take 40 mg by mouth daily.      Magnesium 250 MG TABS Take 250 mg by mouth daily.      Multiple Vitamin (MULTIVITAMIN WITH MINERALS) TABS tablet Take 1 tablet by mouth 2 (two) times a week.      naphazoline-pheniramine (EYE ALLERGY RELIEF) 0.025-0.3 % ophthalmic solution 1-2 drops 4 (four) times daily as needed for eye irritation.      TURMERIC-GINGER PO Take 2 tablets by mouth daily. Gummies      No current facility-administered medications for this visit.   Allergies[1]  Social History   Tobacco Use   Smoking status: Never   Smokeless tobacco: Never  Substance Use Topics   Alcohol use: Yes    Comment: occasionally    Family History  Problem Relation Age of Onset   Malignant hyperthermia Sister       Review of Systems  Musculoskeletal:  Positive for arthralgias.  All other systems reviewed and are negative.    Objective:  Physical Exam Constitutional:      General: He is not in acute distress.    Appearance: Normal appearance. He is normal weight.  HENT:     Head: Normocephalic and atraumatic.  Eyes:     Extraocular Movements: Extraocular movements intact.     Conjunctiva/sclera: Conjunctivae normal.     Pupils: Pupils are equal, round, and reactive to light.  Cardiovascular:     Rate and Rhythm: Normal rate and regular rhythm.     Pulses: Normal pulses.  Pulmonary:     Effort: Pulmonary effort is normal. No respiratory distress.  Abdominal:     General: There is no distension.     Palpations: Abdomen is soft.   Musculoskeletal:        General: Tenderness present.     Cervical back: Normal range of motion and neck supple.     Comments: TTP over medial and lateral joint line, medial worse than lateral.  No calf tenderness, swelling, or erythema.  Well healed vertical incision, otherwise no overlying lesions of area of chief complaint.  Decreased strength and ROM due to elicited pain.  Pre-operative ROM extension no less than 10 degrees.  Dorsiflexion and plantarflexion intact.  Stable to varus and valgus stress.  BLE appear grossly neurovascularly intact.  Gait mildly antalgic.   Skin:    General: Skin is warm and dry.     Capillary Refill: Capillary refill takes less than 2 seconds.     Findings: No erythema or rash.  Neurological:     General: No focal deficit present.     Mental Status: He is alert and oriented to person, place, and time.  Psychiatric:        Mood and Affect: Mood normal.        Behavior: Behavior normal.     Vital signs in last 24 hours: @VSRANGES @  Labs:  Estimated body mass index is 27.72 kg/m as calculated from the following:   Height as of 01/09/22: 5' 8 (1.727 m).   Weight as of 01/09/22: 82.7 kg.  Imaging Review Plain radiographs demonstrate total knee arthroplasty components of the left knee(s) in adequate position without adverse features. The overall alignment is neutral.There is no significant evidence of loosening of the femoral and tibial components. The bone quality appears to be fair for age and reported activity level.     Assessment/Plan:  Post-operative stiffness and pain, flexion contracture, right knee(s) with failed previous arthroplasty.   The patient history, physical examination, clinical judgment of the provider and imaging studies are consistent with Post-operative stiffness and pain, flexion contracture of the right knee(s), previous total knee arthroplasty. Revision total knee arthroplasty, specifically poly exchange, is deemed medically  necessary. The treatment options including medical management, injection therapy, arthroscopy and revision arthroplasty were discussed at length. The risks and benefits of revision total knee arthroplasty were presented and reviewed. The risks due to aseptic loosening, infection, stiffness, patella tracking problems, thromboembolic complications and other imponderables were discussed. This also included the possibility of transitioning plan from poly exchange to include but not limited to full total knee revision.  The patient acknowledged the explanation, agreed to proceed with the plan and consent was signed. Patient is being admitted for inpatient treatment for surgery, pain control, PT, OT, prophylactic antibiotics, VTE prophylaxis, progressive ambulation and ADL's and discharge planning.The patient is planning to be discharged home with OPPT      [1] No Known Allergies

## 2024-03-03 NOTE — Progress Notes (Signed)
 Sent message, via epic in basket, requesting orders in epic from Careers adviser.

## 2024-03-07 NOTE — Progress Notes (Addendum)
 Date of COVID positive in last 90 days:  PCP - Abigail Brinks, PA Cardiologist - Cordella Inches, MD LOV 02/14/24  Cardiac clearance by Cordella Inches, MD 02/14/24 in Epic   Chest x-ray - 11/10/23 [facility] EKG - 02/14/24 [facility] Stress Test - N/A ECHO - 11/13/23 [facility] Cardiac Cath - N/A Pacemaker/ICD device last checked:N/A Spinal Cord Stimulator:N/A  Bowel Prep - N/A  Sleep Study - N/A CPAP -   Fasting Blood Sugar - N/A Checks Blood Sugar _____ times a day  Last dose of GLP1 agonist-  N/A GLP1 instructions:  Do not take after     Last dose of SGLT-2 inhibitors-  N/A SGLT-2 instructions:  Do not take after     Blood Thinner Instructions: N/A Last dose:   Time: Aspirin Instructions:N/A Last Dose:  Activity level: Can go up a flight of stairs and perform activities of daily living without stopping and without symptoms of chest pain or shortness of breath.  Anesthesia review: family hx of malignant hyperthermia, moderate CAD  Patient denies shortness of breath, fever, cough and chest pain at PAT appointment  Patient verbalized understanding of instructions that were given to them at the PAT appointment. Patient was also instructed that they will need to review over the PAT instructions again at home before surgery.

## 2024-03-11 ENCOUNTER — Encounter (HOSPITAL_COMMUNITY): Payer: Self-pay

## 2024-03-11 ENCOUNTER — Other Ambulatory Visit: Payer: Self-pay

## 2024-03-11 ENCOUNTER — Encounter (HOSPITAL_COMMUNITY)
Admission: RE | Admit: 2024-03-11 | Discharge: 2024-03-11 | Disposition: A | Discharge status: Home / Self Care | Source: Ambulatory Visit | Attending: Orthopedic Surgery | Admitting: Orthopedic Surgery

## 2024-03-11 VITALS — BP 146/83 | HR 69 | Temp 97.8°F | Resp 16 | Ht 68.0 in | Wt 181.0 lb

## 2024-03-11 DIAGNOSIS — F419 Anxiety disorder, unspecified: Secondary | ICD-10-CM | POA: Diagnosis not present

## 2024-03-11 DIAGNOSIS — Z01818 Encounter for other preprocedural examination: Secondary | ICD-10-CM | POA: Diagnosis present

## 2024-03-11 DIAGNOSIS — G8929 Other chronic pain: Secondary | ICD-10-CM | POA: Diagnosis not present

## 2024-03-11 DIAGNOSIS — Z01812 Encounter for preprocedural laboratory examination: Secondary | ICD-10-CM | POA: Diagnosis not present

## 2024-03-11 DIAGNOSIS — M25661 Stiffness of right knee, not elsewhere classified: Secondary | ICD-10-CM | POA: Diagnosis not present

## 2024-03-11 DIAGNOSIS — Z8489 Family history of other specified conditions: Secondary | ICD-10-CM | POA: Diagnosis not present

## 2024-03-11 DIAGNOSIS — T84012A Broken internal right knee prosthesis, initial encounter: Secondary | ICD-10-CM | POA: Diagnosis not present

## 2024-03-11 DIAGNOSIS — M25561 Pain in right knee: Secondary | ICD-10-CM | POA: Insufficient documentation

## 2024-03-11 DIAGNOSIS — I2510 Atherosclerotic heart disease of native coronary artery without angina pectoris: Secondary | ICD-10-CM | POA: Insufficient documentation

## 2024-03-11 HISTORY — DX: Atherosclerotic heart disease of native coronary artery without angina pectoris: I25.10

## 2024-03-11 LAB — CBC WITH DIFFERENTIAL/PLATELET
Abs Immature Granulocytes: 0.01 K/uL (ref 0.00–0.07)
Basophils Absolute: 0.1 K/uL (ref 0.0–0.1)
Basophils Relative: 1 %
Eosinophils Absolute: 0.2 K/uL (ref 0.0–0.5)
Eosinophils Relative: 2 %
HCT: 47 % (ref 39.0–52.0)
Hemoglobin: 15.6 g/dL (ref 13.0–17.0)
Immature Granulocytes: 0 %
Lymphocytes Relative: 37 %
Lymphs Abs: 2.7 K/uL (ref 0.7–4.0)
MCH: 30.1 pg (ref 26.0–34.0)
MCHC: 33.2 g/dL (ref 30.0–36.0)
MCV: 90.6 fL (ref 80.0–100.0)
Monocytes Absolute: 0.9 K/uL (ref 0.1–1.0)
Monocytes Relative: 12 %
Neutro Abs: 3.5 K/uL (ref 1.7–7.7)
Neutrophils Relative %: 48 %
Platelets: 273 K/uL (ref 150–400)
RBC: 5.19 MIL/uL (ref 4.22–5.81)
RDW: 13.4 % (ref 11.5–15.5)
WBC: 7.2 K/uL (ref 4.0–10.5)
nRBC: 0 % (ref 0.0–0.2)

## 2024-03-11 LAB — COMPREHENSIVE METABOLIC PANEL WITH GFR
ALT: 20 U/L (ref 0–44)
AST: 26 U/L (ref 15–41)
Albumin: 4.4 g/dL (ref 3.5–5.0)
Alkaline Phosphatase: 65 U/L (ref 38–126)
Anion gap: 10 (ref 5–15)
BUN: 15 mg/dL (ref 8–23)
CO2: 26 mmol/L (ref 22–32)
Calcium: 9.8 mg/dL (ref 8.9–10.3)
Chloride: 101 mmol/L (ref 98–111)
Creatinine, Ser: 1 mg/dL (ref 0.61–1.24)
GFR, Estimated: >60 mL/min
Glucose, Bld: 67 mg/dL — ABNORMAL LOW (ref 70–99)
Potassium: 4.4 mmol/L (ref 3.5–5.1)
Sodium: 137 mmol/L (ref 135–145)
Total Bilirubin: 0.6 mg/dL (ref 0.0–1.2)
Total Protein: 7.7 g/dL (ref 6.5–8.1)

## 2024-03-11 LAB — SURGICAL PCR SCREEN
MRSA, PCR: NEGATIVE
Staphylococcus aureus: NEGATIVE

## 2024-03-11 NOTE — Patient Instructions (Addendum)
 SURGICAL WAITING ROOM VISITATION  Patients having surgery or a procedure may have no more than 2 support people in the waiting area - these visitors may rotate.    Children ages 12 and under will not be able to visit patients in [facility] under most circumstances.   Visitors with respiratory illnesses are discouraged from visiting and should remain at home.  If the patient needs to stay at the hospital during part of their recovery, the visitor guidelines for inpatient rooms apply. Pre-op nurse will coordinate an appropriate time for 1 support person to accompany patient in pre-op.  This support person may not rotate.    Please refer to the [facility] website for the visitor guidelines for Inpatients (after your surgery is over and you are in a regular room).    Your procedure is scheduled on: 03/19/24   Report to [facility] Main Entrance    Report to admitting at 6:00 AM   Call this number if you have problems the morning of surgery [phone]   Do not eat food :After Midnight.   After Midnight you may have the following liquids until 5:30 AM DAY OF SURGERY  Water Non-Citrus Juices (without pulp, NO RED-Apple, White grape, White cranberry) Black Coffee (NO MILK/CREAM OR CREAMERS, sugar ok)  Clear Tea (NO MILK/CREAM OR CREAMERS, sugar ok) regular and decaf                             Plain Jell-O (NO RED)                                           Fruit ices (not with fruit pulp, NO RED)                                     Popsicles (NO RED)                                                               Sports drinks like Gatorade (NO RED)    The day of surgery:  Drink ONE (1) Pre-Surgery Clear Ensure at 5:30 AM the morning of surgery. Drink in one sitting. Do not sip.  This drink was given to you during your hospital  pre-op appointment visit. Nothing else to drink after completing the  Pre-Surgery Clear Ensure          If you have questions, please  contact your surgeons office.   FOLLOW BOWEL PREP AND ANY ADDITIONAL PRE OP INSTRUCTIONS YOU RECEIVED FROM YOUR SURGEON'S OFFICE!!!     Oral Hygiene is also important to reduce your risk of infection.                                    Remember - BRUSH YOUR TEETH THE MORNING OF SURGERY WITH YOUR REGULAR TOOTHPASTE  DENTURES WILL BE REMOVED PRIOR TO SURGERY PLEASE DO NOT APPLY Poly grip OR ADHESIVES!!!   Stop all vitamins and herbal supplements 7  days before surgery.   Take these medicines the morning of surgery with A SIP OF WATER: Alprazolam, Citalopram             You may not have any metal on your body including jewelry, and body piercing             Do not wear lotions, powders, cologne, or deodorant              Men may shave face and neck.   Do not bring valuables to the hospital. [facility] IS NOT             RESPONSIBLE   FOR VALUABLES.   Contacts, glasses, dentures or bridgework may not be worn into surgery.  DO NOT BRING YOUR HOME MEDICATIONS TO THE HOSPITAL. PHARMACY WILL DISPENSE MEDICATIONS LISTED ON YOUR MEDICATION LIST TO YOU DURING YOUR ADMISSION IN THE HOSPITAL!    Patients discharged on the day of surgery will not be allowed to drive home.  Someone NEEDS to stay with you for the first 24 hours after anesthesia.              Please read over the following fact sheets you were given: IF YOU HAVE QUESTIONS ABOUT YOUR PRE-OP INSTRUCTIONS PLEASE CALL [phone].   If you received a COVID test during your pre-op visit  it is requested that you wear a mask when out in public, stay away from anyone that may not be feeling well and notify your surgeon if you develop symptoms. If you test positive for Covid or have been in contact with anyone that has tested positive in the last 10 days please notify you surgeon.      Pre-operative 4 CHG Bath Instructions  DYNA-Hex 4 Chlorhexidine  Gluconate 4% Solution Antiseptic 4 fl. oz   You can play a key role in  reducing the risk of infection after surgery. Your skin needs to be as free of germs as possible. You can reduce the number of germs on your skin by washing with CHG (chlorhexidine  gluconate) soap before surgery. CHG is an antiseptic soap that kills germs and continues to kill germs even after washing.   DO NOT use if you have an allergy to chlorhexidine /CHG or antibacterial soaps. If your skin becomes reddened or irritated, stop using the CHG and notify one of our RNs at   Please shower with the CHG soap starting 4 days before surgery using the following schedule:     Please keep in mind the following:  DO NOT shave, including legs and underarms, starting the day of your first shower.   You may shave your face at any point before/day of surgery.  Place clean sheets on your bed the day you start using CHG soap. Use a clean washcloth (not used since being washed) for each shower. DO NOT sleep with pets once you start using the CHG.  CHG Shower Instructions:  If you choose to wash your hair and private area, wash first with your normal shampoo/soap.  After you use shampoo/soap, rinse your hair and body thoroughly to remove shampoo/soap residue.  Turn the water OFF and apply about 3 tablespoons (45 ml) of CHG soap to a CLEAN washcloth.  Apply CHG soap ONLY FROM YOUR NECK DOWN TO YOUR TOES (washing for 3-5 minutes)  DO NOT use CHG soap on face, private areas, open wounds, or sores.  Pay special attention to the area where your surgery is being performed.  If you are  having back surgery, having someone wash your back for you may be helpful. Wait 2 minutes after CHG soap is applied, then you may rinse off the CHG soap.  Pat dry with a clean towel  Put on clean clothes/pajamas   If you choose to wear lotion, please use ONLY the CHG-compatible lotions on the back of this paper.     Additional instructions for the day of surgery: DO NOT APPLY any lotions, deodorants, cologne, or perfumes.   Put  on clean/comfortable clothes.  Brush your teeth.  Ask your nurse before applying any prescription medications to the skin.   CHG Compatible Lotions   Aveeno Moisturizing lotion  Cetaphil Moisturizing Cream  Cetaphil Moisturizing Lotion  Clairol Herbal Essence Moisturizing Lotion, Dry Skin  Clairol Herbal Essence Moisturizing Lotion, Extra Dry Skin  Clairol Herbal Essence Moisturizing Lotion, Normal Skin  Curel Age Defying Therapeutic Moisturizing Lotion with Alpha Hydroxy  Curel Extreme Care Body Lotion  Curel Soothing Hands Moisturizing Hand Lotion  Curel Therapeutic Moisturizing Cream, Fragrance-Free  Curel Therapeutic Moisturizing Lotion, Fragrance-Free  Curel Therapeutic Moisturizing Lotion, Original Formula  Eucerin Daily Replenishing Lotion  Eucerin Dry Skin Therapy Plus Alpha Hydroxy Crme  Eucerin Dry Skin Therapy Plus Alpha Hydroxy Lotion  Eucerin Original Crme  Eucerin Original Lotion  Eucerin Plus Crme Eucerin Plus Lotion  Eucerin TriLipid Replenishing Lotion  Keri Anti-Bacterial Hand Lotion  Keri Deep Conditioning Original Lotion Dry Skin Formula Softly Scented  Keri Deep Conditioning Original Lotion, Fragrance Free Sensitive Skin Formula  Keri Lotion Fast Absorbing Fragrance Free Sensitive Skin Formula  Keri Lotion Fast Absorbing Softly Scented Dry Skin Formula  Keri Original Lotion  Keri Skin Renewal Lotion Keri Silky Smooth Lotion  Keri Silky Smooth Sensitive Skin Lotion  Nivea Body Creamy Conditioning Oil  Nivea Body Extra Enriched Lotion  Nivea Body Original Lotion  Nivea Body Sheer Moisturizing Lotion Nivea Crme  Nivea Skin Firming Lotion  NutraDerm 30 Skin Lotion  NutraDerm Skin Lotion  NutraDerm Therapeutic Skin Cream  NutraDerm Therapeutic Skin Lotion  ProShield Protective Hand Cream  Provon moisturizing lotion   Incentive Spirometer  An incentive spirometer is a tool that can help keep your lungs clear and active. This tool measures how well  you are filling your lungs with each breath. Taking long deep breaths may help reverse or decrease the chance of developing breathing (pulmonary) problems (especially infection) following: A long period of time when you are unable to move or be active. BEFORE THE PROCEDURE  If the spirometer includes an indicator to show your best effort, your nurse or respiratory therapist will set it to a desired goal. If possible, sit up straight or lean slightly forward. Try not to slouch. Hold the incentive spirometer in an upright position. INSTRUCTIONS FOR USE  Sit on the edge of your bed if possible, or sit up as far as you can in bed or on a chair. Hold the incentive spirometer in an upright position. Breathe out normally. Place the mouthpiece in your mouth and seal your lips tightly around it. Breathe in slowly and as deeply as possible, raising the piston or the ball toward the top of the column. Hold your breath for 3-5 seconds or for as long as possible. Allow the piston or ball to fall to the bottom of the column. Remove the mouthpiece from your mouth and breathe out normally. Rest for a few seconds and repeat Steps 1 through 7 at least 10 times every 1-2 hours when you are awake.  Take your time and take a few normal breaths between deep breaths. The spirometer may include an indicator to show your best effort. Use the indicator as a goal to work toward during each repetition. After each set of 10 deep breaths, practice coughing to be sure your lungs are clear. If you have an incision (the cut made at the time of surgery), support your incision when coughing by placing a pillow or rolled up towels firmly against it. Once you are able to get out of bed, walk around indoors and cough well. You may stop using the incentive spirometer when instructed by your caregiver.  RISKS AND COMPLICATIONS Take your time so you do not get dizzy or light-headed. If you are in pain, you may need to take or ask for  pain medication before doing incentive spirometry. It is harder to take a deep breath if you are having pain. AFTER USE Rest and breathe slowly and easily. It can be helpful to keep track of a log of your progress. Your caregiver can provide you with a simple table to help with this. If you are using the spirometer at home, follow these instructions: SEEK MEDICAL CARE IF:  You are having difficultly using the spirometer. You have trouble using the spirometer as often as instructed. Your pain medication is not giving enough relief while using the spirometer. You develop fever of 100.5 F (38.1 C) or higher. SEEK IMMEDIATE MEDICAL CARE IF:  You cough up bloody sputum that had not been present before. You develop fever of 102 F (38.9 C) or greater. You develop worsening pain at or near the incision site. MAKE SURE YOU:  Understand these instructions. Will watch your condition. Will get help right away if you are not doing well or get worse. Document Released: 07/03/2006 Document Revised: 05/15/2011 Document Reviewed: 09/03/2006 Exit[employer] Patient Information 2014 [employer], [place].   ________________________________________________________________________  ________________________________________________________________________ WHAT IS A BLOOD TRANSFUSION? Blood Transfusion Information  A transfusion is the replacement of blood or some of its parts. Blood is made up of multiple cells which provide different functions. Red blood cells carry oxygen and are used for blood loss replacement. White blood cells fight against infection. Platelets control bleeding. Plasma helps clot blood. Other blood products are available for specialized needs, such as hemophilia or other clotting disorders. BEFORE THE TRANSFUSION  Who gives blood for transfusions?  Healthy volunteers who are fully evaluated to make sure their blood is safe. This is blood bank blood. Transfusion therapy is the safest it has  ever been in the practice of medicine. Before blood is taken from a donor, a complete history is taken to make sure that person has no history of diseases nor engages in risky social behavior (examples are intravenous drug use or sexual activity with multiple partners). The donor's travel history is screened to minimize risk of transmitting infections, such as malaria. The donated blood is tested for signs of infectious diseases, such as HIV and hepatitis. The blood is then tested to be sure it is compatible with you in order to minimize the chance of a transfusion reaction. If you or a relative donates blood, this is often done in anticipation of surgery and is not appropriate for emergency situations. It takes many days to process the donated blood. RISKS AND COMPLICATIONS Although transfusion therapy is very safe and saves many lives, the main dangers of transfusion include:  Getting an infectious disease. Developing a transfusion reaction. This is an allergic reaction to something in  the blood you were given. Every precaution is taken to prevent this. The decision to have a blood transfusion has been considered carefully by your caregiver before blood is given. Blood is not given unless the benefits outweigh the risks. AFTER THE TRANSFUSION Right after receiving a blood transfusion, you will usually feel much better and more energetic. This is especially true if your red blood cells have gotten low (anemic). The transfusion raises the level of the red blood cells which carry oxygen, and this usually causes an energy increase. The nurse administering the transfusion will monitor you carefully for complications. HOME CARE INSTRUCTIONS  No special instructions are needed after a transfusion. You may find your energy is better. Speak with your caregiver about any limitations on activity for underlying diseases you may have. SEEK MEDICAL CARE IF:  Your condition is not improving after your  transfusion. You develop redness or irritation at the intravenous (IV) site. SEEK IMMEDIATE MEDICAL CARE IF:  Any of the following symptoms occur over the next 12 hours: Shaking chills. You have a temperature by mouth above 102 F (38.9 C), not controlled by medicine. Chest, back, or muscle pain. People around you feel you are not acting correctly or are confused. Shortness of breath or difficulty breathing. Dizziness and fainting. You get a rash or develop hives. You have a decrease in urine output. Your urine turns a dark color or changes to pink, red, or brown. Any of the following symptoms occur over the next 10 days: You have a temperature by mouth above 102 F (38.9 C), not controlled by medicine. Shortness of breath. Weakness after normal activity. The white part of the eye turns yellow (jaundice). You have a decrease in the amount of urine or are urinating less often. Your urine turns a dark color or changes to pink, red, or brown. Document Released: 02/18/2000 Document Revised: 05/15/2011 Document Reviewed: 10/07/2007 [facility] Patient Information 2014 [address], [place].  _______________________________________________________________________

## 2024-03-12 ENCOUNTER — Encounter (HOSPITAL_COMMUNITY): Payer: Self-pay

## 2024-03-12 NOTE — Anesthesia Preprocedure Evaluation (Addendum)
 "                                  Anesthesia Evaluation  Patient identified by MRN, date of birth, ID band Patient awake    Reviewed: Allergy & Precautions, NPO status , Patient's Chart, lab work & pertinent test results  History of Anesthesia Complications (+) MALIGNANT HYPERTHERMIA, Family history of anesthesia reaction and history of anesthetic complications  Airway Mallampati: II  TM Distance: >3 FB Neck ROM: Full    Dental no notable dental hx. (+) Teeth Intact, Dental Advisory Given   Pulmonary neg pulmonary ROS   Pulmonary exam normal breath sounds clear to auscultation       Cardiovascular + CAD  Normal cardiovascular exam Rhythm:Regular Rate:Normal     Neuro/Psych   Anxiety     negative neurological ROS     GI/Hepatic negative GI ROS, Neg liver ROS,,,  Endo/Other  negative endocrine ROS    Renal/GU Lab Results      Component                Value               Date                          K                        4.4                 03/11/2024               CREATININE               1.00                03/11/2024                        Musculoskeletal  (+) Arthritis ,    Abdominal   Peds  Hematology Lab Results      Component                Value               Date                      WBC                      7.2                 03/11/2024                HGB                      15.6                03/11/2024                HCT                      47.0                03/11/2024                MCV  90.6                03/11/2024                PLT                      273                 03/11/2024              Anesthesia Other Findings All: Hydrocodone Oxycodone   Reproductive/Obstetrics                              Anesthesia Physical Anesthesia Plan  ASA: 3  Anesthesia Plan: Spinal and Regional   Post-op Pain Management: Regional block*, Minimal or no pain anticipated and Ofirmev   IV (intra-op)*   Induction: Intravenous  PONV Risk Score and Plan: Treatment may vary due to age or medical condition, Midazolam  and Propofol  infusion  Airway Management Planned: Nasal Cannula and Natural Airway  Additional Equipment: None  Intra-op Plan:   Post-operative Plan:   Informed Consent: I have reviewed the patients History and Physical, chart, labs and discussed the procedure including the risks, benefits and alternatives for the proposed anesthesia with the patient or authorized representative who has indicated his/her understanding and acceptance.     Dental advisory given  Plan Discussed with:   Anesthesia Plan Comments: (See PAT note from 1/6  Spinal w R adductor)         Anesthesia Quick Evaluation  "

## 2024-03-12 NOTE — Progress Notes (Signed)
 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  Per prior anesthesia notes from 2023: He reported previous contact with [facility] at the Malignant Hyperthermia clinic, but was never contacted to follow through with muscle biopsy. He has had 7 prior surgeries and trigger free anesthesia has been utilized in the past. His surgery is scheduled for the afternoon as there was not a morning time readily available for his surgeon, but he was concerned because he had always been told he should be a first case so anesthesia lines could be flushed overnight. Confirmed with anesthesiologist Rumalda Bare, MD and discussed with Isaac Knight that filters can now be used and newer machines also able to flush more quickly, so he does not require a first case start.  Patient evaluated by cardiology at [facility] for preop clearance on 02/14/2024 due to history of nonobstructive CAD on imaging.  Patient denied any symptoms and is able to complete greater than 4 METS.  He was cleared for surgery:  Pre-operative risk stratification.   From a cardiac standpoint, the patient is asymptomatic and is able to complete > 4 METS. There is no concern for any high risk features (unstable coronary syndrome, unstable arrhythmia, decompensated heart failure, significant valvular disease). Previous cardiovascular work-up includes coronary CTA which diagnosed moderate nonobstructive CAD.  Echocardiogram which showed normal left ventricular and valvular function.  ECG shows no abnormalities today.   VS: BP  (!) 146/83   Pulse 69   Temp 36.6 C (Oral)   Resp 16   Ht 5' 8 (1.727 m)   Wt 82.1 kg   SpO2 98%   BMI 27.52 kg/m   PROVIDERS: Group, [facility]   LABS: Labs reviewed: Acceptable for surgery. (all labs ordered are listed, but only abnormal results are displayed)  Labs Reviewed  COMPREHENSIVE METABOLIC PANEL WITH GFR - Abnormal; Notable for the following components:      Result Value   Glucose, Bld 67 (*)    All other components within normal limits  SURGICAL PCR SCREEN  CBC WITH DIFFERENTIAL/PLATELET  TYPE AND SCREEN      EKG 02/14/2024 ([facility]):  Normal sinus rhythm Normal ECG   CC TA 11/14/2023 ([facility]):  Impression IMPRESSION: -  Calcium score of 146.  This places the patient in the 67th percentile for age and race based on the [place] data base.  -  LAD:  Proximal calcified and noncalcified plaque. Short segment stenosis in the 50-69 % range.  -  RCA:  Proximal calcified and noncalcified plaque. Short segment stenosis in the 30-49% range.  This study will be sent to FFR CT for further analysis.  FFRct interpretation: 1. The stenosis in the proximal LAD has a low likelihood of lesion specific ischemia with an FFRct value of 0.97. 2. The stenosis in the proximal RCA has a low likelihood of lesion specific ischemia with an FFRct value of 0.96.  Echo 11/13/2023 ([facility]):  Impression Aorta: The aortic root is normal in size - 3.3cm. The ascending aorta is borderline dilated - 3.85cm.   Left Ventricle:  Systolic function is normal. EF: 55-60%. Wall motion is normal. Doppler parameters indicate normal diastolic function.   Tricuspid Valve: The right ventricular systolic pressure is normal (<36 mmHg).  No significant valvular disease was noted.  Past Medical History:  Diagnosis Date   Anxiety    Family history of adverse reaction to anesthesia    Malignant hyperthermia    Sister had it 50+ years ago    Past Surgical History:  Procedure Laterality  Date   ARTHROSCOPY, HIP, WITH LABRUM REPAIR Right    ac joint at same time   GANGLION CYST EXCISION  03/06/1996   left wrist   INGUINAL HERNIA REPAIR  03/06/2008   left   MEDIAL COLLATERAL LIGAMENT REPAIR, ELBOW Left    MENISCUS REPAIR  2021   ROTATOR CUFF REPAIR  1988   left   SEPTOPLASTY  03/06/1998   TONSILLECTOMY  03/06/1998   TOTAL KNEE ARTHROPLASTY Right 2022   UMBILICAL HERNIA REPAIR N/A 01/09/2022   Procedure: UMBILICAL HERNIA REPAIR;  Surgeon: Lyndel Deward PARAS, MD;  Location: [facility];  Service: General;  Laterality: N/A;   uvuloplasty  03/06/1998    MEDICATIONS:  ALPRAZolam (XANAX) 0.5 MG tablet   APPLE CIDER VINEGAR PO   aspirin EC 81 MG tablet   citalopram (CELEXA) 40 MG tablet   ibuprofen (ADVIL) 200 MG tablet   pravastatin (PRAVACHOL) 40 MG tablet   valACYclovir (VALTREX) 500 MG tablet   No current facility-administered medications for this encounter.   [name] [facility] Short Stay/Anesthesiology [facility] Phone [phone] 03/12/2024 11:55 AM      "

## 2024-03-19 ENCOUNTER — Ambulatory Visit (HOSPITAL_COMMUNITY)

## 2024-03-19 ENCOUNTER — Encounter (HOSPITAL_COMMUNITY): Payer: Self-pay | Admitting: Orthopedic Surgery

## 2024-03-19 ENCOUNTER — Encounter (HOSPITAL_COMMUNITY)
Admission: RE | Disposition: A | Discharge status: Home / Self Care | Payer: Self-pay | Source: Ambulatory Visit | Attending: Orthopedic Surgery

## 2024-03-19 ENCOUNTER — Ambulatory Visit (HOSPITAL_COMMUNITY): Admission: RE | Admit: 2024-03-19 | Admitting: Orthopedic Surgery

## 2024-03-19 ENCOUNTER — Ambulatory Visit (HOSPITAL_COMMUNITY): Payer: Self-pay | Admitting: Medical

## 2024-03-19 ENCOUNTER — Other Ambulatory Visit: Payer: Self-pay

## 2024-03-19 ENCOUNTER — Ambulatory Visit (HOSPITAL_COMMUNITY): Payer: Self-pay | Admitting: Anesthesiology

## 2024-03-19 DIAGNOSIS — I2510 Atherosclerotic heart disease of native coronary artery without angina pectoris: Secondary | ICD-10-CM | POA: Insufficient documentation

## 2024-03-19 DIAGNOSIS — Z79899 Other long term (current) drug therapy: Secondary | ICD-10-CM | POA: Insufficient documentation

## 2024-03-19 DIAGNOSIS — X58XXXA Exposure to other specified factors, initial encounter: Secondary | ICD-10-CM | POA: Insufficient documentation

## 2024-03-19 DIAGNOSIS — F419 Anxiety disorder, unspecified: Secondary | ICD-10-CM | POA: Insufficient documentation

## 2024-03-19 DIAGNOSIS — M24661 Ankylosis, right knee: Secondary | ICD-10-CM | POA: Insufficient documentation

## 2024-03-19 DIAGNOSIS — M24561 Contracture, right knee: Secondary | ICD-10-CM | POA: Insufficient documentation

## 2024-03-19 DIAGNOSIS — T8482XA Fibrosis due to internal orthopedic prosthetic devices, implants and grafts, initial encounter: Secondary | ICD-10-CM | POA: Insufficient documentation

## 2024-03-19 DIAGNOSIS — M1990 Unspecified osteoarthritis, unspecified site: Secondary | ICD-10-CM | POA: Insufficient documentation

## 2024-03-19 HISTORY — PX: REIMPLANTATION OF TOTAL KNEE: SHX6052

## 2024-03-19 LAB — TYPE AND SCREEN
ABO/RH(D): O POS
Antibody Screen: NEGATIVE

## 2024-03-19 LAB — ABO/RH: ABO/RH(D): O POS

## 2024-03-19 SURGERY — REVISION, TOTAL ARTHROPLASTY, KNEE
Anesthesia: Regional | Site: Knee | Laterality: Right

## 2024-03-19 MED ORDER — PHENYLEPHRINE HCL-NACL 20-0.9 MG/250ML-% IV SOLN
INTRAVENOUS | Status: DC | PRN
  Administered 2024-03-19: 50 ug/min via INTRAVENOUS

## 2024-03-19 MED ORDER — MIDAZOLAM HCL 2 MG/2ML IJ SOLN
INTRAMUSCULAR | Status: AC
  Filled 2024-03-19: qty 2

## 2024-03-19 MED ORDER — TRANEXAMIC ACID-NACL 1000-0.7 MG/100ML-% IV SOLN
INTRAVENOUS | Status: DC | PRN
  Administered 2024-03-19: 1000 mg via INTRAVENOUS

## 2024-03-19 MED ORDER — FENTANYL CITRATE (PF) 100 MCG/2ML IJ SOLN
INTRAMUSCULAR | Status: DC | PRN
  Administered 2024-03-19 (×2): 50 ug via INTRAVENOUS

## 2024-03-19 MED ORDER — HYDROMORPHONE HCL 2 MG PO TABS: 2.0000 mg | ORAL_TABLET | ORAL | 0 refills | Status: AC | PRN

## 2024-03-19 MED ORDER — CHLORHEXIDINE GLUCONATE 0.12 % MT SOLN
15.0000 mL | Freq: Once | OROMUCOSAL | Status: AC
  Administered 2024-03-19: 15 mL via OROMUCOSAL

## 2024-03-19 MED ORDER — BUPIVACAINE LIPOSOME 1.3 % IJ SUSP: 20.0000 mL | Freq: Once | INTRAMUSCULAR | Status: DC

## 2024-03-19 MED ORDER — ONDANSETRON HCL 4 MG/2ML IJ SOLN: 4.0000 mg | Freq: Once | INTRAMUSCULAR | Status: DC | PRN

## 2024-03-19 MED ORDER — ONDANSETRON HCL 4 MG/2ML IJ SOLN
INTRAMUSCULAR | Status: AC
  Filled 2024-03-19: qty 2

## 2024-03-19 MED ORDER — ACETAMINOPHEN 500 MG PO TABS: 1000.0000 mg | ORAL_TABLET | Freq: Three times a day (TID) | ORAL | Status: AC | PRN

## 2024-03-19 MED ORDER — DEXAMETHASONE SOD PHOSPHATE PF 10 MG/ML IJ SOLN: 8.0000 mg | Freq: Once | INTRAMUSCULAR | Status: DC

## 2024-03-19 MED ORDER — HYDROMORPHONE HCL 2 MG PO TABS
ORAL_TABLET | ORAL | Status: AC
  Filled 2024-03-19: qty 1

## 2024-03-19 MED ORDER — ONDANSETRON HCL 4 MG PO TABS: 4.0000 mg | ORAL_TABLET | Freq: Four times a day (QID) | ORAL | Status: DC | PRN

## 2024-03-19 MED ORDER — 0.9 % SODIUM CHLORIDE (POUR BTL) OPTIME
TOPICAL | Status: DC | PRN
  Administered 2024-03-19: 1000 mL

## 2024-03-19 MED ORDER — KETOROLAC TROMETHAMINE 15 MG/ML IJ SOLN
INTRAMUSCULAR | Status: AC
  Filled 2024-03-19: qty 1

## 2024-03-19 MED ORDER — LACTATED RINGERS IV BOLUS
500.0000 mL | Freq: Once | INTRAVENOUS | Status: AC
  Administered 2024-03-19: 500 mL via INTRAVENOUS

## 2024-03-19 MED ORDER — CEFAZOLIN SODIUM-DEXTROSE 2-4 GM/100ML-% IV SOLN
2.0000 g | INTRAVENOUS | Status: AC
  Administered 2024-03-19: 2 g via INTRAVENOUS
  Filled 2024-03-19: qty 100

## 2024-03-19 MED ORDER — SODIUM CHLORIDE (PF) 0.9 % IJ SOLN
INTRAMUSCULAR | Status: AC
  Filled 2024-03-19: qty 30

## 2024-03-19 MED ORDER — HYDROMORPHONE HCL 1 MG/ML IJ SOLN
INTRAMUSCULAR | Status: AC
  Filled 2024-03-19: qty 1

## 2024-03-19 MED ORDER — METHOCARBAMOL 500 MG PO TABS
ORAL_TABLET | ORAL | Status: AC
  Filled 2024-03-19: qty 1

## 2024-03-19 MED ORDER — HYDROMORPHONE HCL 2 MG PO TABS: 1.0000 mg | ORAL_TABLET | ORAL | Status: DC | PRN

## 2024-03-19 MED ORDER — SODIUM CHLORIDE 0.9 % IV SOLN: INTRAVENOUS | Status: DC

## 2024-03-19 MED ORDER — DEXAMETHASONE SOD PHOSPHATE PF 10 MG/ML IJ SOLN
INTRAMUSCULAR | Status: DC | PRN
  Administered 2024-03-19: 10 mg via INTRAVENOUS

## 2024-03-19 MED ORDER — MIDAZOLAM HCL (PF) 2 MG/2ML IJ SOLN
INTRAMUSCULAR | Status: DC | PRN
  Administered 2024-03-19: 2 mg via INTRAVENOUS

## 2024-03-19 MED ORDER — METHOCARBAMOL 500 MG PO TABS
500.0000 mg | ORAL_TABLET | Freq: Four times a day (QID) | ORAL | Status: DC | PRN
  Administered 2024-03-19: 500 mg via ORAL

## 2024-03-19 MED ORDER — ASPIRIN 81 MG PO TBEC: 81.0000 mg | DELAYED_RELEASE_TABLET | Freq: Two times a day (BID) | ORAL | Status: AC

## 2024-03-19 MED ORDER — HYDROMORPHONE HCL 1 MG/ML IJ SOLN: 0.5000 mg | INTRAMUSCULAR | Status: DC | PRN

## 2024-03-19 MED ORDER — LACTATED RINGERS IV SOLN: INTRAVENOUS | Status: DC | PRN

## 2024-03-19 MED ORDER — ONDANSETRON HCL 4 MG/2ML IJ SOLN
INTRAMUSCULAR | Status: DC | PRN
  Administered 2024-03-19: 4 mg via INTRAVENOUS

## 2024-03-19 MED ORDER — PROPOFOL 10 MG/ML IV BOLUS
INTRAVENOUS | Status: DC | PRN
  Administered 2024-03-19: 50 mg via INTRAVENOUS
  Administered 2024-03-19: 115 ug/kg/min via INTRAVENOUS

## 2024-03-19 MED ORDER — LIDOCAINE HCL (PF) 2 % IJ SOLN
INTRAMUSCULAR | Status: DC | PRN
  Administered 2024-03-19: 25 mg via INTRADERMAL

## 2024-03-19 MED ORDER — SODIUM CHLORIDE (PF) 0.9 % IJ SOLN
INTRAMUSCULAR | Status: DC | PRN
  Administered 2024-03-19: 80 mL

## 2024-03-19 MED ORDER — HYDROMORPHONE HCL 2 MG PO TABS
2.0000 mg | ORAL_TABLET | ORAL | Status: DC | PRN
  Administered 2024-03-19: 2 mg via ORAL

## 2024-03-19 MED ORDER — OXYCODONE HCL 5 MG PO TABS: 5.0000 mg | ORAL_TABLET | Freq: Once | ORAL | Status: DC | PRN

## 2024-03-19 MED ORDER — LACTATED RINGERS IV SOLN: INTRAVENOUS | Status: DC

## 2024-03-19 MED ORDER — ACETAMINOPHEN 500 MG PO TABS: 1000.0000 mg | ORAL_TABLET | Freq: Four times a day (QID) | ORAL | Status: DC

## 2024-03-19 MED ORDER — OXYCODONE HCL 5 MG/5ML PO SOLN: 5.0000 mg | Freq: Once | ORAL | Status: DC | PRN

## 2024-03-19 MED ORDER — BUPIVACAINE LIPOSOME 1.3 % IJ SUSP
INTRAMUSCULAR | Status: AC
  Filled 2024-03-19: qty 20

## 2024-03-19 MED ORDER — CELECOXIB 100 MG PO CAPS: 100.0000 mg | ORAL_CAPSULE | Freq: Two times a day (BID) | ORAL | 0 refills | Status: AC

## 2024-03-19 MED ORDER — WATER FOR IRRIGATION, STERILE IR SOLN
Status: DC | PRN
  Administered 2024-03-19: 2000 mL

## 2024-03-19 MED ORDER — LACTATED RINGERS IV BOLUS
250.0000 mL | Freq: Once | INTRAVENOUS | Status: AC
  Administered 2024-03-19: 250 mL via INTRAVENOUS

## 2024-03-19 MED ORDER — ORAL CARE MOUTH RINSE: 15.0000 mL | Freq: Once | OROMUCOSAL | Status: AC

## 2024-03-19 MED ORDER — PHENYLEPHRINE HCL (PRESSORS) 10 MG/ML IV SOLN
INTRAVENOUS | Status: AC
  Filled 2024-03-19: qty 1

## 2024-03-19 MED ORDER — PHENYLEPHRINE 80 MCG/ML (10ML) SYRINGE FOR IV PUSH (FOR BLOOD PRESSURE SUPPORT)
PREFILLED_SYRINGE | INTRAVENOUS | Status: AC
  Filled 2024-03-19: qty 10

## 2024-03-19 MED ORDER — DEXAMETHASONE SOD PHOSPHATE PF 10 MG/ML IJ SOLN
INTRAMUSCULAR | Status: AC
  Filled 2024-03-19: qty 1

## 2024-03-19 MED ORDER — FENTANYL CITRATE (PF) 100 MCG/2ML IJ SOLN
INTRAMUSCULAR | Status: AC
  Filled 2024-03-19: qty 2

## 2024-03-19 MED ORDER — CEFAZOLIN SODIUM-DEXTROSE 2-4 GM/100ML-% IV SOLN
INTRAVENOUS | Status: AC
  Filled 2024-03-19: qty 100

## 2024-03-19 MED ORDER — HYDROMORPHONE HCL 1 MG/ML IJ SOLN
0.2500 mg | INTRAMUSCULAR | Status: DC | PRN
  Administered 2024-03-19 (×2): 0.5 mg via INTRAVENOUS

## 2024-03-19 MED ORDER — BUPIVACAINE-EPINEPHRINE (PF) 0.25% -1:200000 IJ SOLN
INTRAMUSCULAR | Status: AC
  Filled 2024-03-19: qty 30

## 2024-03-19 MED ORDER — ROPIVACAINE HCL 5 MG/ML IJ SOLN
INTRAMUSCULAR | Status: DC | PRN
  Administered 2024-03-19: 25 mL via PERINEURAL

## 2024-03-19 MED ORDER — METHOCARBAMOL 1000 MG/10ML IJ SOLN: 500.0000 mg | Freq: Four times a day (QID) | INTRAMUSCULAR | Status: DC | PRN

## 2024-03-19 MED ORDER — MEPIVACAINE HCL (PF) 2 % IJ SOLN
INTRAMUSCULAR | Status: DC | PRN
  Administered 2024-03-19: 68 mg via INTRATHECAL

## 2024-03-19 MED ORDER — DROPERIDOL 2.5 MG/ML IJ SOLN: 0.6250 mg | Freq: Once | INTRAMUSCULAR | Status: DC | PRN

## 2024-03-19 MED ORDER — ACETAMINOPHEN 500 MG PO TABS
1000.0000 mg | ORAL_TABLET | Freq: Once | ORAL | Status: AC
  Administered 2024-03-19: 1000 mg via ORAL
  Filled 2024-03-19: qty 2

## 2024-03-19 MED ORDER — POVIDONE-IODINE 10 % EX SWAB
2.0000 | Freq: Once | CUTANEOUS | Status: AC
  Administered 2024-03-19: 2 via TOPICAL

## 2024-03-19 MED ORDER — CLONIDINE HCL (ANALGESIA) 100 MCG/ML EP SOLN
EPIDURAL | Status: DC | PRN
  Administered 2024-03-19: 100 ug

## 2024-03-19 MED ORDER — PROPOFOL 1000 MG/100ML IV EMUL
INTRAVENOUS | Status: AC
  Filled 2024-03-19: qty 100

## 2024-03-19 MED ORDER — SODIUM CHLORIDE 0.9 % IR SOLN
Status: DC | PRN
  Administered 2024-03-19: 3000 mL

## 2024-03-19 MED ORDER — ACETAMINOPHEN 325 MG PO TABS: 325.0000 mg | ORAL_TABLET | Freq: Four times a day (QID) | ORAL | Status: DC | PRN

## 2024-03-19 MED ORDER — PHENYLEPHRINE 80 MCG/ML (10ML) SYRINGE FOR IV PUSH (FOR BLOOD PRESSURE SUPPORT)
PREFILLED_SYRINGE | INTRAVENOUS | Status: DC | PRN
  Administered 2024-03-19: 40 ug via INTRAVENOUS

## 2024-03-19 MED ORDER — PROPOFOL 10 MG/ML IV BOLUS
INTRAVENOUS | Status: AC
  Filled 2024-03-19: qty 20

## 2024-03-19 MED ORDER — CEFAZOLIN SODIUM-DEXTROSE 2-4 GM/100ML-% IV SOLN
2.0000 g | Freq: Four times a day (QID) | INTRAVENOUS | Status: DC
  Administered 2024-03-19: 2 g via INTRAVENOUS

## 2024-03-19 MED ORDER — ONDANSETRON 4 MG PO TBDP: 4.0000 mg | ORAL_TABLET | Freq: Three times a day (TID) | ORAL | 0 refills | Status: AC | PRN

## 2024-03-19 MED ORDER — ACETAMINOPHEN 10 MG/ML IV SOLN: 1000.0000 mg | Freq: Once | INTRAVENOUS | Status: DC | PRN

## 2024-03-19 MED ORDER — METHOCARBAMOL 500 MG PO TABS: 500.0000 mg | ORAL_TABLET | Freq: Three times a day (TID) | ORAL | 0 refills | Status: AC | PRN

## 2024-03-19 MED ORDER — TRANEXAMIC ACID-NACL 1000-0.7 MG/100ML-% IV SOLN
1000.0000 mg | INTRAVENOUS | Status: DC
  Filled 2024-03-19: qty 100

## 2024-03-19 MED ORDER — ISOPROPYL ALCOHOL 70 % SOLN
Status: DC | PRN
  Administered 2024-03-19: 1 via TOPICAL

## 2024-03-19 MED ORDER — KETOROLAC TROMETHAMINE 15 MG/ML IJ SOLN
15.0000 mg | Freq: Four times a day (QID) | INTRAMUSCULAR | Status: DC
  Administered 2024-03-19: 15 mg via INTRAVENOUS

## 2024-03-19 MED ORDER — LACTATED RINGERS IV BOLUS: 250.0000 mL | Freq: Once | INTRAVENOUS | Status: DC

## 2024-03-19 MED ORDER — ONDANSETRON HCL 4 MG/2ML IJ SOLN: 4.0000 mg | Freq: Four times a day (QID) | INTRAMUSCULAR | Status: DC | PRN

## 2024-03-19 SURGICAL SUPPLY — 47 items
ATTUNE PSRP INSR SZ7 5 KNEE (Insert) IMPLANT
BAG COUNTER SPONGE SURGICOUNT (BAG) IMPLANT
BLADE SAG 18X100X1.27 (BLADE) ×1 IMPLANT
BLADE SAW SAG 35X64 .89 (BLADE) ×1 IMPLANT
BLADE SAW SGTL 81X20 HD (BLADE) IMPLANT
BNDG COHESIVE 4X5 TAN STRL LF (GAUZE/BANDAGES/DRESSINGS) ×1 IMPLANT
BNDG ELASTIC 6X10 VLCR STRL LF (GAUZE/BANDAGES/DRESSINGS) ×1 IMPLANT
BOWL SMART MIX CTS (DISPOSABLE) IMPLANT
CANISTER WOUND CARE 500ML ATS (WOUND CARE) ×1 IMPLANT
CHLORAPREP W/TINT 26 (MISCELLANEOUS) ×2 IMPLANT
CNTNR URN SCR LID CUP LEK RST (MISCELLANEOUS) IMPLANT
COVER SURGICAL LIGHT HANDLE (MISCELLANEOUS) ×1 IMPLANT
CUFF TRNQT CYL 34X4.125X (TOURNIQUET CUFF) ×1 IMPLANT
DRAPE INCISE IOBAN 85X60 (DRAPES) ×1 IMPLANT
DRAPE SHEET LG 3/4 BI-LAMINATE (DRAPES) ×1 IMPLANT
DRAPE U-SHAPE 47X51 STRL (DRAPES) ×1 IMPLANT
DRESSING PEEL AND PLAC PRVNA20 (GAUZE/BANDAGES/DRESSINGS) ×1 IMPLANT
ELECT REM PT RETURN 15FT ADLT (MISCELLANEOUS) ×1 IMPLANT
GAUZE SPONGE 4X4 12PLY STRL (GAUZE/BANDAGES/DRESSINGS) ×1 IMPLANT
GLOVE BIO SURGEON STRL SZ 6.5 (GLOVE) ×2 IMPLANT
GLOVE BIOGEL PI IND STRL 6.5 (GLOVE) ×1 IMPLANT
GLOVE BIOGEL PI IND STRL 8 (GLOVE) ×1 IMPLANT
GLOVE SURG ORTHO 8.0 STRL STRW (GLOVE) ×2 IMPLANT
GOWN STRL REUS W/ TWL XL LVL3 (GOWN DISPOSABLE) ×2 IMPLANT
HOLDER FOLEY CATH W/STRAP (MISCELLANEOUS) IMPLANT
HOOD PEEL AWAY T7 (MISCELLANEOUS) ×3 IMPLANT
KIT DRSG PREVENA PLUS 7DAY 125 (MISCELLANEOUS) ×1 IMPLANT
KIT TURNOVER KIT A (KITS) ×1 IMPLANT
MANIFOLD NEPTUNE II (INSTRUMENTS) ×1 IMPLANT
MARKER SKIN DUAL TIP RULER LAB (MISCELLANEOUS) ×1 IMPLANT
NS IRRIG 1000ML POUR BTL (IV SOLUTION) ×1 IMPLANT
PACK TOTAL KNEE CUSTOM (KITS) ×1 IMPLANT
PENCIL SMOKE EVACUATOR (MISCELLANEOUS) ×1 IMPLANT
PROTECTOR NERVE ULNAR (MISCELLANEOUS) ×1 IMPLANT
SET HNDPC FAN SPRY TIP SCT (DISPOSABLE) ×1 IMPLANT
SOLUTION IRRIG SURGIPHOR (IV SOLUTION) IMPLANT
SOLUTION PRONTOSAN WOUND 350ML (IRRIGATION / IRRIGATOR) IMPLANT
SPIKE FLUID TRANSFER (MISCELLANEOUS) ×1 IMPLANT
SUT ETHILON 3 0 PS 1 (SUTURE) ×4 IMPLANT
SUT STRATAFIX 14 PDO 48 VLT (SUTURE) ×1 IMPLANT
SUT VIC AB 0 CT1 36 (SUTURE) ×1 IMPLANT
SUT VIC AB 2-0 CT2 27 (SUTURE) ×2 IMPLANT
SUTURE STRATFX 0 PDS 27 VIOLET (SUTURE) ×1 IMPLANT
SYR 50ML LL SCALE MARK (SYRINGE) ×1 IMPLANT
TRAY FOLEY MTR SLVR 16FR STAT (SET/KITS/TRAYS/PACK) ×1 IMPLANT
TUBE SUCTION HIGH CAP CLEAR NV (SUCTIONS) ×1 IMPLANT
UNDERPAD 30X36 HEAVY ABSORB (UNDERPADS AND DIAPERS) ×1 IMPLANT

## 2024-03-19 NOTE — Anesthesia Procedure Notes (Signed)
 Spinal  Patient location during procedure: OR End time: 03/19/2024 8:29 AM Reason for block: surgical anesthesia  Staffing Performed: anesthesiologist  Authorized by: Jefm Garnette LABOR, MD   Performed by: Jefm Garnette LABOR, MD  Preanesthetic Checklist Completed: patient identified, IV checked, risks and benefits discussed, surgical consent, monitors and equipment checked, pre-op evaluation and timeout performed Spinal Block Patient position: sitting Prep: DuraPrep and site prepped and draped Patient monitoring: heart rate, cardiac monitor, continuous pulse ox and blood pressure Approach: midline Location: L3-4 Injection technique: single-shot Needle Needle type: Pencan  Needle gauge: 24 G Needle length: 10 cm Needle insertion depth (cm): 6 Assessment Sensory level: T4 Events: CSF return  Additional Notes 1  Attempt (s). Pt tolerated procedure well.

## 2024-03-19 NOTE — Op Note (Signed)
 DATE OF SURGERY:  03/19/2024 TIME: 9:41 AM  PATIENT NAME:  Isaac Knight   AGE: 64 y.o.    PRE-OPERATIVE DIAGNOSIS: Right knee arthrofibrosis and stiffness  POST-OPERATIVE DIAGNOSIS:  Same  PROCEDURE: Revision right total Knee Arthroplasty with complete synovectomy, posterior capsular release, IT band release, and poly exchange Application of incisional wound VAC 20 x 3 cm  SURGEON:  Oluwakemi Salsberry A Layken Doenges, MD   ASSISTANT: Jon Hurst, RNFA, present and scrubbed throughout the case, critical for assistance with exposure, retraction, instrumentation, and closure.   OPERATIVE IMPLANTS:  DePuy attune 5 mm insert for size 7 tibia femur Implant Name Type Inv. Item Serial No. Manufacturer Lot No. LRB No. Used Action  ATTUNE PSRP INSR SZ7 5 KNEE - [id number] Insert ATTUNE PSRP INSR SZ7 5 KNEE  [employer] [id number] Right 1 Implanted      PREOPERATIVE INDICATIONS:  Isaac Knight is a 64 y.o. year old male who had previously undergone right total knee arthroplasty by another surgeon here in town in 2022.  He has had a difficult time since surgery with continued pain and discomfort.  They notes that the knee feels overly stiff, has been unable to achieve full extension, has pain along the anterior lateral aspects of the knee.  He has also had a arthroscopic synovectomy but despite this continued to remain stiff and tight.  We also performed a aspiration of the knee to assess for infection and Synovasure results came back negative for infection.  Ultimately felt and felt he would benefit from an open synovectomy, liner exchange, posterior capsular release. The risks, benefits, and alternatives were discussed at length including but not limited to the risks of infection, bleeding, nerve injury, stiffness, blood clots, the need for revision surgery, cardiopulmonary complications, among others, and they were willing to proceed.  ESTIMATED BLOOD LOSS: 50cc  OPERATIVE DESCRIPTION:   Once  adequate anesthesia was induced, preoperative antibiotics, 2 gm of ancef ,1 gm of Tranexamic Acid , and 8 mg of Decadron  administered, the patient was positioned supine with a right thigh tourniquet placed.  The right lower extremity was prepped and draped in sterile fashion.  A time-  out was performed identifying the patient, planned procedure, and the appropriate extremity.     The leg was  exsanguinated, tourniquet elevated to 250 mmHg.  A midline incision was  made followed by median parapatellar arthrotomy. Anterior horn of the medial meniscus was released and resected. A medial release was performed, the infrapatellar fat pad was resected with care taken to protect the patellar tendon.  A complete synovectomy was performed.  There was significant diffuse arthrofibrosis and adhesions.  Once adequate exposure was obtained he was evaluated.  The knee was a very tight on the medial side and essentially had no play.  The IT band felt tight laterally.  The knee had about a 10 degree flexion contracture with about 120 degrees of flexion.  Next the medial release was extended to help loosen up the medial side.  An osteotome was used to remove the previous poly insert.  It was confirmed to be a size 6 mm insert.  With the insert removed we then performed a posterior capsular release using a Cobb and krego.  A trial 5 mm insert was then placed into the knee.  The knee was taken through a range of motion.  Laterally the IT band felt still mildly tight and a pie crusting of the IT band was performed with a 15 blade.  At this  point we were pleased that the patient had full extension and improved range of motion. The femoral tibial and patellar components were felt to be well-fixed.  There was significant scar tissue around the patella and a synovectomy was performed circumferentially.    I confirmed that I was satisfied with the range of motion and stability, and the final 5 mm RP poly insert was chosen.  It was  placed into the knee.         The tourniquet had been let down. No significant hemostasis was required.  The medial parapatellar arthrotomy was then reapproximated using #1 Stratafix sutures with the knee  in flexion.  The remaining wound was closed with 0 stratafix, 2-0 Vicryl, and running 3-0 nylon.  A incisional Prevena wound VAC 20 x 3 cm was applied to the incision..  The patient was then brought to recovery room in stable condition, tolerating the procedure  well. There were no complications.   Post op recs: WB: WBAT Abx: ancef  Imaging: PACU xrays DVT prophylaxis: Aspirin  81mg  BID x4 weeks Follow up: 2 weeks after surgery for a wound check with Dr. Edna at [facility].  Address: [address], [place], [place] [address]  Office Phone: [phone]  Toribio Edna, MD Orthopaedic Surgery

## 2024-03-19 NOTE — Interval H&P Note (Signed)
 The patient has been re-examined, and the chart reviewed, and there have been no interval changes to the documented history and physical.    Plan for R TKA synovectomy and liner exchange for arthrofibrosis  The operative side was examined and the patient was confirmed to have sensation to DPN, SPN, TN intact, Motor EHL, ext, flex 5/5, and DP 2+, PT 2+, No significant edema.   The risks, benefits, and alternatives have been discussed at length with patient, and the patient is willing to proceed.  Right knee marked. Consent has been signed.

## 2024-03-19 NOTE — Anesthesia Postprocedure Evaluation (Deleted)
"   Anesthesia Post Note  Patient: Isaac Knight  Procedure(s) Performed: REVISION, TOTAL ARTHROPLASTY, KNEE- POLY EXCHANGE (Right: Knee)     Patient location during evaluation: PACU Anesthesia Type: Regional Level of consciousness: oriented and awake and alert Pain management: pain level controlled Vital Signs Assessment: post-procedure vital signs reviewed and stable Respiratory status: spontaneous breathing and respiratory function stable Cardiovascular status: blood pressure returned to baseline and stable Postop Assessment: no headache, no backache, no apparent nausea or vomiting and patient able to bend at knees Anesthetic complications: no   No notable events documented.  Last Vitals:  Vitals:   03/19/24 1245 03/19/24 1300  BP: 122/62 132/78  Pulse: 64 76  Resp: 13 13  Temp:  36.5 C  SpO2: 93% 99%    Last Pain:  Vitals:   03/19/24 1300  TempSrc:   PainSc: 5                  Garnette LABOR Liviah Cake      "

## 2024-03-19 NOTE — Anesthesia Procedure Notes (Addendum)
 Procedure Name: MAC Date/Time: 03/19/2024 8:35 AM  Performed by: Obadiah Reyes BROCKS, CRNAPre-anesthesia Checklist: Patient identified, Emergency Drugs available, Suction available, Patient being monitored and Timeout performed Patient Re-evaluated:Patient Re-evaluated prior to induction Oxygen Delivery Method: Simple face mask Preoxygenation: Pre-oxygenation with 100% oxygen Induction Type: IV induction Airway Equipment and Method: Oral airway

## 2024-03-19 NOTE — Transfer of Care (Signed)
 Immediate Anesthesia Transfer of Care Note  Patient: Isaac Knight  Procedure(s) Performed: REVISION, TOTAL ARTHROPLASTY, KNEE- POLY EXCHANGE (Right: Knee)  Patient Location: PACU  Anesthesia Type:MAC, Regional, and Spinal  Level of Consciousness: sedated and responds to stimulation  Airway & Oxygen Therapy: Patient Spontanous Breathing and Patient connected to face mask oxygen  Post-op Assessment: Report given to RN and Post -op Vital signs reviewed and stable  Post vital signs: Reviewed and stable  Last Vitals:  Vitals Value Taken Time  BP 114/68 03/19/24 10:09  Temp 36.5 C 03/19/24 10:05  Pulse 58 03/19/24 10:09  Resp 11 03/19/24 10:09  SpO2 99 % 03/19/24 10:09    Last Pain:  Vitals:   03/19/24 1005  TempSrc:   PainSc: Asleep      Patients Stated Pain Goal: 4 (03/19/24 0647)  Complications: No notable events documented.

## 2024-03-19 NOTE — Progress Notes (Signed)
 Orthopedic Tech Progress Note Patient Details:  Isaac Knight [birth year] [id number]  Ortho Devices Type of Ortho Device: Bone foam zero knee Ortho Device/Splint Location: right Ortho Device/Splint Interventions: Ordered, Application, Adjustment   Post Interventions Patient Tolerated: Well Instructions Provided: Adjustment of device, Care of device  Waylan Thom Loving 03/19/2024, 1:05 PM

## 2024-03-19 NOTE — Evaluation (Signed)
 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 Pt PMH includes but is not limited to anxiety, L RTC repair, L elbow surgery, R TKA (2022) and subsequent 2 R knee surgeries and hernia repair.  Clinical Impression  [name] is a 64 y.o. male POD 0 s/p R TKA revision. Patient reports IND with mobility at baseline. Patient is now limited by functional impairments (see PT problem list below) and requires CGA for transfers and gait with RW. Patient was able to ambulate 55 feet x2  with RW and CGA and cues for safe walker management. Patient educated on safe sequencing for stair mobility with one handrail, fall risk prevention, use of RW, use of CP/ice as well as bone foam, simple management of wound vac including use of clip or strap, slowly increasing activity level, focus on R knee extension, pain management and goal and car transfers pt and spouse verbalized understanding of safe guarding position for people assisting with mobility. Patient instructed in exercises to facilitate ROM and circulation reviewed and HO provided. Patient will benefit from continued skilled PT interventions to address impairments and progress towards PLOF. Patient has met mobility goals at adequate level for discharge home with family support and OPPT services; will continue to follow if pt continues acute stay to progress towards Mod I goals.       If plan is discharge home, recommend the following: A little help with walking and/or transfers;A little help with bathing/dressing/bathroom;Assistance with cooking/housework;Assist for transportation;Help  with stairs or ramp for entrance   Can travel by private vehicle        Equipment Recommendations Rolling walker (2 wheels)  Recommendations for Other Services       Functional Status Assessment Patient has had a recent decline in their functional status and demonstrates the ability to make significant improvements in function in a reasonable and predictable amount of time.     Precautions / Restrictions Precautions Precautions: Fall;Knee Restrictions Weight Bearing Restrictions Per Provider Order: No      Mobility  Bed Mobility Overal bed mobility: Needs Assistance Bed Mobility: Supine to Sit     Supine to sit: Supervision     General bed mobility comments: min cues    Transfers Overall transfer level: Needs assistance Equipment used: Rolling walker (2 wheels) Transfers: Sit to/from Stand Sit to Stand: Contact guard assist           General transfer comment: min cues    Ambulation/Gait Ambulation/Gait assistance: Contact guard assist, Supervision Gait Distance (Feet): 55 Feet Assistive device: Rolling walker (2 wheels) Gait Pattern/deviations: Step-to pattern, Decreased stance time - right, Antalgic, Trunk flexed Gait velocity: decreased     General Gait Details: slight trunk flexion to offload R LE in stance phase a focus on R knee extension in stance phase, min cues for safety and RW management  Stairs Stairs: Yes Stairs assistance: Contact guard assist Stair Management: One rail Left, Two rails Number of Stairs: 2 General stair comments: step navigation inititated with B handrails and CGA with cues for safety, sequencing and step to technque and progressed to CGA and use of L handrail only to emulate home setting  Wheelchair Mobility  Tilt Bed    Modified Rankin (Stroke Patients Only)       Balance Overall balance assessment: Needs assistance Sitting-balance support: Feet supported Sitting balance-Leahy Scale: Good     Standing  balance support: Bilateral upper extremity supported, During functional activity, Reliant on assistive device for balance Standing balance-Leahy Scale: Fair Standing balance comment: static standing no UE support                             Pertinent Vitals/Pain Pain Assessment Pain Assessment: 0-10 Pain Score: 7  Pain Location: R LE and knee Pain Descriptors / Indicators: Aching, Discomfort, Constant, Grimacing, Guarding Pain Intervention(s): Limited activity within patient's tolerance, Monitored during session, Premedicated before session, Repositioned, Ice applied    Home Living Family/patient expects to be discharged to:: Private residence Living Arrangements: Spouse/significant other;Children Available Help at Discharge: Family Type of Home: House Home Access: Stairs to enter Entrance Stairs-Rails: Left Entrance Stairs-Number of Steps: 3   Home Layout: Multi-level;Able to live on main level with bedroom/bathroom Home Equipment: [place] - single point      Prior Function Prior Level of Function : Independent/Modified Independent;Driving             Mobility Comments: IND no AD for all ADLs, self care tasks and IADLs       Extremity/Trunk Assessment        Lower Extremity Assessment Lower Extremity Assessment: RLE deficits/detail    Cervical / Trunk Assessment Cervical / Trunk Assessment: Normal  Communication   Communication Communication: No apparent difficulties    Cognition Arousal: Alert Behavior During Therapy: WFL for tasks assessed/performed   PT - Cognitive impairments: No apparent impairments                         Following commands: Intact       Cueing       General Comments      Exercises Total Joint Exercises Ankle Circles/Pumps: AROM, Both, 15 reps Quad Sets: AROM, Right, 5 reps Short Arc Quad: AROM, Right, 5 reps Heel Slides: AROM, Right, 5 reps Hip ABduction/ADduction: AROM, Right, 5 reps Straight Leg  Raises: AROM, Right, 5 reps Knee Flexion: AROM, Right, 5 reps   Assessment/Plan    PT Assessment Patient needs continued PT services  PT Problem List Decreased strength;Decreased range of motion;Decreased activity tolerance;Decreased balance;Decreased mobility;Decreased coordination;Pain       PT Treatment Interventions DME instruction;Gait training;Stair training;Functional mobility training;Therapeutic activities;Therapeutic exercise;Balance training;Neuromuscular re-education;Patient/family education;Modalities    PT Goals (Current goals can be found in the Care Plan section)  Acute Rehab PT Goals Patient Stated Goal: to be able to get back to being more active and no pain and keep R leg straight PT Goal Formulation: With patient Time For Goal Achievement: 04/02/24 Potential to Achieve Goals: Good    Frequency 7X/week     Co-evaluation               AM-PAC PT 6 Clicks Mobility  Outcome Measure Help needed turning from your back to your side while in a flat bed without using bedrails?: None Help needed moving from lying on your back to sitting on the side of a flat bed without using bedrails?: A Little Help needed moving to and from a bed to a chair (including a wheelchair)?: A Little Help needed standing up from a chair using your arms (e.g., wheelchair or bedside chair)?: A Little Help  needed to walk in hospital room?: A Little Help needed climbing 3-5 steps with a railing? : A Little 6 Click Score: 19    End of Session Equipment Utilized During Treatment: Gait belt Activity Tolerance: No increased pain;Patient tolerated treatment well Patient left: in chair;with call bell/phone within reach;with family/visitor present Nurse Communication: Mobility status;Other (comment) (pt readiness for same day d/c from PT standpoint) PT Visit Diagnosis: Unsteadiness on feet (R26.81);Other abnormalities of gait and mobility (R26.89);Muscle weakness (generalized) (M62.81);Other  symptoms and signs involving the nervous system (R29.898);Pain Pain - Right/Left: Right Pain - part of body: Knee;Leg    Time: 8647-8560 PT Time Calculation (min) (ACUTE ONLY): 47 min   Charges:   PT Evaluation $PT Eval Low Complexity: 1 Low PT Treatments $Gait Training: 8-22 mins $Therapeutic Exercise: 8-22 mins PT General Charges $$ ACUTE PT VISIT: 1 Visit         [place], PT Acute Rehab   [place] VEAR Drone 03/19/2024, 2:52 PM

## 2024-03-19 NOTE — Anesthesia Procedure Notes (Signed)
 Anesthesia Regional Block: Adductor canal block   Pre-Anesthetic Checklist: , timeout performed,  Correct Patient, Correct Site, Correct Laterality,  Correct Procedure, Correct Position, site marked,  Risks and benefits discussed,  Surgical consent,  Pre-op evaluation,  At surgeon's request and post-op pain management  Laterality: Lower and Right  Prep: chloraprep       Needles:  Injection technique: Single-shot  Needle Type: Echogenic Needle     Needle Length: 9cm  Needle Gauge: 22     Additional Needles:   Procedures:,,,, ultrasound used (permanent image in chart),,    Narrative:  Start time: 03/19/2024 8:02 AM End time: 03/19/2024 8:07 AM Injection made incrementally with aspirations every 5 mL.  Performed by: Personally  Anesthesiologist: Jefm Garnette LABOR, MD  Additional Notes: Block assessed prior to surgery. Pt tolerated procedure well.

## 2024-03-19 NOTE — Anesthesia Postprocedure Evaluation (Signed)
"   Anesthesia Post Note  Patient: SONNIE PAWLOSKI  Procedure(s) Performed: REVISION, TOTAL ARTHROPLASTY, KNEE- POLY EXCHANGE (Right: Knee)     Patient location during evaluation: PACU Anesthesia Type: Regional and Spinal Level of consciousness: oriented and awake and alert Pain management: pain level controlled Vital Signs Assessment: post-procedure vital signs reviewed and stable Respiratory status: spontaneous breathing and respiratory function stable Cardiovascular status: blood pressure returned to baseline and stable Postop Assessment: no headache, no backache, no apparent nausea or vomiting and patient able to bend at knees Anesthetic complications: no   No notable events documented.  Last Vitals:  Vitals:   03/19/24 1245 03/19/24 1300  BP: 122/62 132/78  Pulse: 64 76  Resp: 13 13  Temp:  36.5 C  SpO2: 93% 99%    Last Pain:  Vitals:   03/19/24 1300  TempSrc:   PainSc: 5                  Garnette LABOR Auston Halfmann      "

## 2024-03-19 NOTE — Discharge Instructions (Signed)

## 2024-03-20 ENCOUNTER — Encounter (HOSPITAL_COMMUNITY): Payer: Self-pay | Admitting: Orthopedic Surgery
